# Patient Record
Sex: Female | Born: 1980 | Race: Black or African American | Hispanic: No | Marital: Single | Smoking: Current every day smoker
Health system: Southern US, Community
[De-identification: ages and names within clinical notes are randomized; demographics above are authoritative.]

## PROBLEM LIST (undated history)

## (undated) DIAGNOSIS — Z3490 Encounter for supervision of normal pregnancy, unspecified, unspecified trimester: Secondary | ICD-10-CM

## (undated) DIAGNOSIS — R634 Abnormal weight loss: Secondary | ICD-10-CM

## (undated) DIAGNOSIS — K219 Gastro-esophageal reflux disease without esophagitis: Secondary | ICD-10-CM

## (undated) DIAGNOSIS — IMO0002 Reserved for concepts with insufficient information to code with codable children: Secondary | ICD-10-CM

## (undated) DIAGNOSIS — L089 Local infection of the skin and subcutaneous tissue, unspecified: Principal | ICD-10-CM

## (undated) DIAGNOSIS — I10 Essential (primary) hypertension: Secondary | ICD-10-CM

## (undated) DIAGNOSIS — R87619 Unspecified abnormal cytological findings in specimens from cervix uteri: Secondary | ICD-10-CM

## (undated) HISTORY — PX: TONSILLECTOMY AND ADENOIDECTOMY: SHX28

## (undated) HISTORY — DX: Essential (primary) hypertension: I10

## (undated) HISTORY — DX: Abnormal weight loss: R63.4

## (undated) HISTORY — PX: TONSILLECTOMY: SUR1361

## (undated) HISTORY — DX: Unspecified abnormal cytological findings in specimens from cervix uteri: R87.619

## (undated) HISTORY — PX: BACK SURGERY: SHX140

## (undated) HISTORY — DX: Reserved for concepts with insufficient information to code with codable children: IMO0002

## (undated) HISTORY — DX: Local infection of the skin and subcutaneous tissue, unspecified: L08.9

## (undated) SURGERY — EXCISION LIPOMA
Anesthesia: General | Laterality: Left

---

## 2004-04-18 ENCOUNTER — Ambulatory Visit (HOSPITAL_COMMUNITY): Admission: RE | Admit: 2004-04-18 | Discharge: 2004-04-18 | Payer: Self-pay | Admitting: *Deleted

## 2004-06-12 ENCOUNTER — Ambulatory Visit (HOSPITAL_COMMUNITY): Admission: AD | Admit: 2004-06-12 | Discharge: 2004-06-12 | Payer: Self-pay | Admitting: *Deleted

## 2004-07-03 ENCOUNTER — Ambulatory Visit (HOSPITAL_COMMUNITY): Admission: RE | Admit: 2004-07-03 | Discharge: 2004-07-03 | Payer: Self-pay | Admitting: *Deleted

## 2004-07-13 ENCOUNTER — Emergency Department (HOSPITAL_COMMUNITY): Admission: EM | Admit: 2004-07-13 | Discharge: 2004-07-13 | Payer: Self-pay | Admitting: Emergency Medicine

## 2004-07-29 ENCOUNTER — Ambulatory Visit (HOSPITAL_COMMUNITY): Admission: RE | Admit: 2004-07-29 | Discharge: 2004-07-29 | Payer: Self-pay | Admitting: *Deleted

## 2004-08-13 ENCOUNTER — Ambulatory Visit (HOSPITAL_COMMUNITY): Admission: RE | Admit: 2004-08-13 | Discharge: 2004-08-13 | Payer: Self-pay | Admitting: Family Medicine

## 2004-08-29 ENCOUNTER — Ambulatory Visit (HOSPITAL_COMMUNITY): Admission: AD | Admit: 2004-08-29 | Discharge: 2004-08-29 | Payer: Self-pay | Admitting: *Deleted

## 2004-09-01 ENCOUNTER — Inpatient Hospital Stay (HOSPITAL_COMMUNITY): Admission: AD | Admit: 2004-09-01 | Discharge: 2004-09-03 | Payer: Self-pay | Admitting: *Deleted

## 2004-11-29 ENCOUNTER — Emergency Department (HOSPITAL_COMMUNITY): Admission: EM | Admit: 2004-11-29 | Discharge: 2004-11-29 | Payer: Self-pay | Admitting: Emergency Medicine

## 2006-05-14 ENCOUNTER — Encounter (INDEPENDENT_AMBULATORY_CARE_PROVIDER_SITE_OTHER): Payer: Self-pay | Admitting: Specialist

## 2006-05-14 ENCOUNTER — Other Ambulatory Visit: Admission: RE | Admit: 2006-05-14 | Discharge: 2006-05-14 | Payer: Self-pay | Admitting: Unknown Physician Specialty

## 2006-07-31 ENCOUNTER — Emergency Department (HOSPITAL_COMMUNITY): Admission: EM | Admit: 2006-07-31 | Discharge: 2006-07-31 | Payer: Self-pay | Admitting: Emergency Medicine

## 2007-02-06 ENCOUNTER — Emergency Department (HOSPITAL_COMMUNITY): Admission: EM | Admit: 2007-02-06 | Discharge: 2007-02-06 | Payer: Self-pay | Admitting: Emergency Medicine

## 2007-03-22 ENCOUNTER — Inpatient Hospital Stay (HOSPITAL_COMMUNITY): Admission: EM | Admit: 2007-03-22 | Discharge: 2007-03-25 | Payer: Self-pay | Admitting: Emergency Medicine

## 2007-09-25 ENCOUNTER — Emergency Department (HOSPITAL_COMMUNITY): Admission: EM | Admit: 2007-09-25 | Discharge: 2007-09-25 | Payer: Self-pay | Admitting: Emergency Medicine

## 2007-12-07 ENCOUNTER — Emergency Department (HOSPITAL_COMMUNITY): Admission: EM | Admit: 2007-12-07 | Discharge: 2007-12-07 | Payer: Self-pay | Admitting: Emergency Medicine

## 2008-04-25 ENCOUNTER — Emergency Department (HOSPITAL_COMMUNITY): Admission: EM | Admit: 2008-04-25 | Discharge: 2008-04-25 | Payer: Self-pay | Admitting: Emergency Medicine

## 2008-04-27 ENCOUNTER — Emergency Department (HOSPITAL_COMMUNITY): Admission: EM | Admit: 2008-04-27 | Discharge: 2008-04-27 | Payer: Self-pay | Admitting: Emergency Medicine

## 2008-09-06 ENCOUNTER — Emergency Department (HOSPITAL_COMMUNITY): Admission: EM | Admit: 2008-09-06 | Discharge: 2008-09-06 | Payer: Self-pay | Admitting: Emergency Medicine

## 2008-09-21 ENCOUNTER — Emergency Department (HOSPITAL_COMMUNITY): Admission: EM | Admit: 2008-09-21 | Discharge: 2008-09-21 | Payer: Self-pay | Admitting: Emergency Medicine

## 2009-03-01 ENCOUNTER — Emergency Department (HOSPITAL_COMMUNITY): Admission: EM | Admit: 2009-03-01 | Discharge: 2009-03-01 | Payer: Self-pay | Admitting: Emergency Medicine

## 2009-05-24 ENCOUNTER — Emergency Department (HOSPITAL_COMMUNITY): Admission: EM | Admit: 2009-05-24 | Discharge: 2009-05-24 | Payer: Self-pay | Admitting: Emergency Medicine

## 2009-06-22 ENCOUNTER — Emergency Department (HOSPITAL_COMMUNITY): Admission: EM | Admit: 2009-06-22 | Discharge: 2009-06-23 | Payer: Self-pay | Admitting: Emergency Medicine

## 2010-04-30 LAB — DIFFERENTIAL
Basophils Absolute: 0 10*3/uL (ref 0.0–0.1)
Basophils Relative: 1 % (ref 0–1)
Eosinophils Absolute: 0.1 10*3/uL (ref 0.0–0.7)
Eosinophils Relative: 1 % (ref 0–5)
Lymphocytes Relative: 19 % (ref 12–46)
Lymphs Abs: 0.9 10*3/uL (ref 0.7–4.0)
Monocytes Absolute: 0.5 10*3/uL (ref 0.1–1.0)
Monocytes Relative: 10 % (ref 3–12)
Neutro Abs: 3.4 10*3/uL (ref 1.7–7.7)
Neutrophils Relative %: 70 % (ref 43–77)

## 2010-04-30 LAB — CBC
HCT: 35.9 % — ABNORMAL LOW (ref 36.0–46.0)
Hemoglobin: 12.4 g/dL (ref 12.0–15.0)
MCHC: 34.7 g/dL (ref 30.0–36.0)
MCV: 91.5 fL (ref 78.0–100.0)
Platelets: 156 10*3/uL (ref 150–400)
RBC: 3.92 MIL/uL (ref 3.87–5.11)
RDW: 12.9 % (ref 11.5–15.5)
WBC: 4.9 10*3/uL (ref 4.0–10.5)

## 2010-04-30 LAB — URINALYSIS, ROUTINE W REFLEX MICROSCOPIC
Bilirubin Urine: NEGATIVE
Glucose, UA: NEGATIVE mg/dL
Hgb urine dipstick: NEGATIVE
Ketones, ur: NEGATIVE mg/dL
Nitrite: NEGATIVE
Protein, ur: NEGATIVE mg/dL
Specific Gravity, Urine: 1.015 (ref 1.005–1.030)
Urobilinogen, UA: 0.2 mg/dL (ref 0.0–1.0)
pH: 6.5 (ref 5.0–8.0)

## 2010-04-30 LAB — BASIC METABOLIC PANEL
BUN: 8 mg/dL (ref 6–23)
CO2: 24 mEq/L (ref 19–32)
Calcium: 8.6 mg/dL (ref 8.4–10.5)
Chloride: 102 mEq/L (ref 96–112)
Creatinine, Ser: 0.64 mg/dL (ref 0.4–1.2)
GFR calc Af Amer: >60 mL/min (ref >60)
GFR calc non Af Amer: >60 mL/min (ref >60)
Glucose, Bld: 89 mg/dL (ref 70–99)
Potassium: 4.2 mEq/L (ref 3.5–5.1)
Sodium: 135 mEq/L (ref 135–145)

## 2010-04-30 LAB — WET PREP, GENITAL
Trich, Wet Prep: NONE SEEN
Yeast Wet Prep HPF POC: NONE SEEN

## 2010-04-30 LAB — PREGNANCY, URINE: Preg Test, Ur: NEGATIVE

## 2010-04-30 LAB — GC/CHLAMYDIA PROBE AMP, GENITAL
Chlamydia, DNA Probe: NEGATIVE
GC Probe Amp, Genital: NEGATIVE

## 2010-05-02 LAB — RAPID STREP SCREEN (MED CTR MEBANE ONLY): Streptococcus, Group A Screen (Direct): NEGATIVE

## 2010-05-03 LAB — CBC
HCT: 38.4 % (ref 36.0–46.0)
Hemoglobin: 13.4 g/dL (ref 12.0–15.0)
MCHC: 34.9 g/dL (ref 30.0–36.0)
MCV: 91.5 fL (ref 78.0–100.0)
Platelets: 172 10*3/uL (ref 150–400)
RBC: 4.2 MIL/uL (ref 3.87–5.11)
RDW: 13.2 % (ref 11.5–15.5)
WBC: 9.2 10*3/uL (ref 4.0–10.5)

## 2010-05-03 LAB — DIFFERENTIAL
Basophils Absolute: 0 10*3/uL (ref 0.0–0.1)
Basophils Relative: 0 % (ref 0–1)
Eosinophils Absolute: 0.2 10*3/uL (ref 0.0–0.7)
Eosinophils Relative: 2 % (ref 0–5)
Lymphocytes Relative: 25 % (ref 12–46)
Lymphs Abs: 2.3 10*3/uL (ref 0.7–4.0)
Monocytes Absolute: 0.5 10*3/uL (ref 0.1–1.0)
Monocytes Relative: 6 % (ref 3–12)
Neutro Abs: 6.2 10*3/uL (ref 1.7–7.7)
Neutrophils Relative %: 67 % (ref 43–77)

## 2010-05-03 LAB — BASIC METABOLIC PANEL
BUN: 11 mg/dL (ref 6–23)
CO2: 26 mEq/L (ref 19–32)
Calcium: 8.6 mg/dL (ref 8.4–10.5)
Chloride: 107 mEq/L (ref 96–112)
Creatinine, Ser: 0.78 mg/dL (ref 0.4–1.2)
GFR calc Af Amer: >60 mL/min (ref >60)
GFR calc non Af Amer: >60 mL/min (ref >60)
Glucose, Bld: 88 mg/dL (ref 70–99)
Potassium: 4.1 mEq/L (ref 3.5–5.1)
Sodium: 137 mEq/L (ref 135–145)

## 2010-05-03 LAB — MONONUCLEOSIS SCREEN: Mono Screen: NEGATIVE

## 2010-05-03 LAB — RPR: RPR Ser Ql: NONREACTIVE

## 2010-05-03 LAB — RAPID STREP SCREEN (MED CTR MEBANE ONLY): Streptococcus, Group A Screen (Direct): NEGATIVE

## 2010-05-11 ENCOUNTER — Emergency Department (HOSPITAL_COMMUNITY)
Admission: EM | Admit: 2010-05-11 | Discharge: 2010-05-11 | Disposition: A | Discharge status: Home / Self Care | Payer: 59 | Attending: Emergency Medicine | Admitting: Emergency Medicine

## 2010-05-11 DIAGNOSIS — I10 Essential (primary) hypertension: Secondary | ICD-10-CM | POA: Insufficient documentation

## 2010-05-11 DIAGNOSIS — Z79899 Other long term (current) drug therapy: Secondary | ICD-10-CM | POA: Insufficient documentation

## 2010-05-11 DIAGNOSIS — T6391XA Toxic effect of contact with unspecified venomous animal, accidental (unintentional), initial encounter: Secondary | ICD-10-CM | POA: Insufficient documentation

## 2010-05-11 DIAGNOSIS — T63391A Toxic effect of venom of other spider, accidental (unintentional), initial encounter: Secondary | ICD-10-CM | POA: Insufficient documentation

## 2010-05-21 LAB — COMPREHENSIVE METABOLIC PANEL
AST: 33 U/L (ref 0–37)
Albumin: 3.7 g/dL (ref 3.5–5.2)
BUN: 7 mg/dL (ref 6–23)
Calcium: 8.4 mg/dL (ref 8.4–10.5)
Creatinine, Ser: 0.7 mg/dL (ref 0.4–1.2)
GFR calc Af Amer: >60 mL/min (ref >60)

## 2010-05-21 LAB — DIFFERENTIAL
Basophils Absolute: 0 10*3/uL (ref 0.0–0.1)
Eosinophils Relative: 2 % (ref 0–5)
Lymphocytes Relative: 33 % (ref 12–46)
Lymphs Abs: 2.2 10*3/uL (ref 0.7–4.0)
Monocytes Absolute: 0.4 10*3/uL (ref 0.1–1.0)
Neutro Abs: 3.9 10*3/uL (ref 1.7–7.7)

## 2010-05-21 LAB — URINALYSIS, ROUTINE W REFLEX MICROSCOPIC
Bilirubin Urine: NEGATIVE
Glucose, UA: NEGATIVE mg/dL
Hgb urine dipstick: NEGATIVE
Ketones, ur: NEGATIVE mg/dL
Nitrite: NEGATIVE
Protein, ur: NEGATIVE mg/dL
Specific Gravity, Urine: 1.01 (ref 1.005–1.030)
Urobilinogen, UA: 0.2 mg/dL (ref 0.0–1.0)
pH: 7.5 (ref 5.0–8.0)

## 2010-05-21 LAB — CBC
HCT: 37.6 % (ref 36.0–46.0)
MCHC: 36 g/dL (ref 30.0–36.0)
MCV: 90.3 fL (ref 78.0–100.0)
Platelets: 166 10*3/uL (ref 150–400)
RDW: 13 % (ref 11.5–15.5)

## 2010-05-21 LAB — PREGNANCY, URINE: Preg Test, Ur: NEGATIVE

## 2010-05-21 LAB — LIPASE, BLOOD: Lipase: 23 U/L (ref 11–59)

## 2010-05-25 LAB — DIFFERENTIAL
Basophils Absolute: 0 10*3/uL (ref 0.0–0.1)
Eosinophils Absolute: 0.1 10*3/uL (ref 0.0–0.7)
Lymphs Abs: 1.3 10*3/uL (ref 0.7–4.0)
Monocytes Absolute: 1 10*3/uL (ref 0.1–1.0)
Neutrophils Relative %: 78 % — ABNORMAL HIGH (ref 43–77)

## 2010-05-25 LAB — COMPREHENSIVE METABOLIC PANEL
ALT: 34 U/L (ref 0–35)
Alkaline Phosphatase: 80 U/L (ref 39–117)
BUN: 7 mg/dL (ref 6–23)
CO2: 27 mEq/L (ref 19–32)
GFR calc non Af Amer: >60 mL/min (ref >60)
Glucose, Bld: 91 mg/dL (ref 70–99)
Potassium: 2.9 mEq/L — ABNORMAL LOW (ref 3.5–5.1)
Sodium: 135 mEq/L (ref 135–145)
Total Protein: 7 g/dL (ref 6.0–8.3)

## 2010-05-25 LAB — CBC
HCT: 35.1 % — ABNORMAL LOW (ref 36.0–46.0)
Hemoglobin: 12.2 g/dL (ref 12.0–15.0)
MCHC: 34.9 g/dL (ref 30.0–36.0)
RBC: 3.79 MIL/uL — ABNORMAL LOW (ref 3.87–5.11)
RDW: 13 % (ref 11.5–15.5)

## 2010-05-25 LAB — URINALYSIS, ROUTINE W REFLEX MICROSCOPIC
Bilirubin Urine: NEGATIVE
Glucose, UA: NEGATIVE mg/dL
Ketones, ur: NEGATIVE mg/dL
Leukocytes, UA: NEGATIVE
Protein, ur: NEGATIVE mg/dL
pH: 6 (ref 5.0–8.0)

## 2010-05-25 LAB — URINE MICROSCOPIC-ADD ON

## 2010-05-25 LAB — URINE CULTURE: Colony Count: >=100000

## 2010-06-27 NOTE — Group Therapy Note (Signed)
NAME:  Bonnie Jones, Bonnie Jones              ACCOUNT NO.:  [id number]   MEDICAL RECORD NO.:  [id number]          PATIENT TYPE:  INP   LOCATION:  A311                          FACILITY:  [facility]   PHYSICIAN:  Dorris Singh, DO    DATE OF BIRTH:  01-07-1981   DATE OF PROCEDURE:  03/22/2007  DATE OF DISCHARGE:                                 PROGRESS NOTE   The patient is seen today.  She states she does not feel any better, is  still having some pain and some difficulty eating.  However, clinically  her white count has decreased.  Will continue her on the current  medication schedule.   PHYSICAL EXAMINATION:  VITAL SIGNS:  Her temperature is 97.0, pulse 54,  respirations 18, blood pressure 109/60.  GENERAL:  The patient is a 30 year old female who is well-developed,  well-nourished and in no acute distress.  NECK:  Her neck is somewhat swollen on the right side with some erythema  in the throat.  HEART:  Regular rate and rhythm.  LUNGS:  Clear to auscultation bilaterally.  ABDOMEN:  Soft, nontender, nondistended.  EXTREMITIES:  Positive pulses.  No edema, ecchymosis.   Labs for today, white count of 8.3, hemoglobin 11.3, hematocrit 33.5 and  her platelet count of 156.  Her chemistries within normal limits.  Her  blood culture currently is no growth for 1 day.  Her strep A probe is  negative and her streptococcus screen direct is negative.   ASSESSMENT AND PLAN:  1. Right peritonsillar phlegmon.  This seems to be, at least her white      count seems to be improving.  Clinically she is still not improved      but hopefully in the next 24 hours we will see that.  We will      continue with a full liquid diet and then increase as tolerated and      will continue with the Unasyn as well.  2. Tobacco abuse.  I spoke with the patient regarding quitting      smoking.  She said she would like to do that.  3. Pharyngitis.  This is consequence of #1.  We will continue with      current antibiotics  and any palliative measures necessary.      Dorris Singh, DO  Electronically Signed     CB/MEDQ  D:  03/23/2007  T:  03/23/2007  Job:  [id number]

## 2010-06-27 NOTE — Discharge Summary (Signed)
NAME:  Bonnie Jones, Bonnie Jones              ACCOUNT NO.:  [id number]   MEDICAL RECORD NO.:  [id number]          PATIENT TYPE:  INP   LOCATION:  A311                          FACILITY:  [facility]   PHYSICIAN:  Dorris Singh, DO    DATE OF BIRTH:  24-Nov-1980   DATE OF ADMISSION:  03/22/2007  DATE OF DISCHARGE:  02/10/2009LH                               DISCHARGE SUMMARY   ADMISSION DIAGNOSIS:  1. Right peritonsillar phlegmon.  2. Tobacco abuse.  3. Pharyngitis.   DISCHARGE DIAGNOSIS:  1. Right peritonsillar phlegmon resolving.  2. Tobacco abuse.   TESTS PERFORMED:  She had a CT on February 9 which showed peritonsilar  phlegmon right peri otitis.   PRIMARY CARE PHYSICIAN:  She does not have one. We will set her up with  Dr. Catalina Pizza at discharge.   HISTORY OF PRESENT ILLNESS:  Her H&P was done by Dr. Dorris Singh. To  summarize, the patient is 30 year old female who presented to the [facility] ED with complaint of throat pain. CT demonstrated above finding.  She was then admitted after talking with Dr. Jearld Fenton from [facility]  that she needed to be placed on 2-3 days of antibiotic therapy.  She was  then admitted to the service of [facility] and started on Unasyn. She was  placed on a clear liquid diet while she was admitted, though the patient  was still having a lot of pain associated with swallowing so she did not  eat. On day 2, she continued to improve.  She was able to eat a little  bit but still having a lot of pain.  We placed her on pain medication as  well. Continue to follow her CBC with her white count continuing to  decrease. On February 9, her white count was absolutely normal. However,  the patient was still having some residual pain and pain in her right  ear. I went ahead and decided to keep her one more day on IV  antibiotics.  Today she was seen and is excited about going home.  Also  she has stated that she will stop smoking.  She has not been smoking  since she  has been here so she has taken smoking cessation information.   Her vitals are normal.  Her blood count and her labs are within normal  limits and it is okay to discharge her at this point in time. On  February 9, she was hyperkalemic. We corrected this with  supplementation. Her condition is stable. Her disposition will be to  home. She will be sent home on the following medications.  1. Augmentin XR 2 tabs p.o. b.i.d. x7 days.  2. Darvocet-N 50 one p.o. q. 4-6 hours p.r.n. pain.   DISCHARGE INSTRUCTIONS:  Take Tylenol and Motrin for pain as scheduled  and then take Darvocet-N 50 one every 4-6 hours for breakthrough pain.  She will be recommended to increase her activity slowly and to be on a  soft diet for the next 2-3 days and then we will set her up an  appointment to see Dr. Catalina Pizza.  It will be explained to her  that she may need a repeat CT of the throat due to this being up a  phlegmon to make sure that there is complete resolution and if she is  still having pain after completing her course of antibiotics, she will  need immediate followup. I have discussed the risks and benefits of not  following up regarding the location of her current infection.      Dorris Singh, DO  Electronically Signed     CB/MEDQ  D:  03/25/2007  T:  03/25/2007  Job:  [id number]

## 2010-06-27 NOTE — H&P (Signed)
NAME:  Bonnie Jones, Bonnie Jones              ACCOUNT NO.:  [id number]   MEDICAL RECORD NO.:  [id number]          PATIENT TYPE:  INP   LOCATION:  A311                          FACILITY:  [facility]   PHYSICIAN:  Dorris Singh, DO    DATE OF BIRTH:  1981/01/30   DATE OF ADMISSION:  03/22/2007  DATE OF DISCHARGE:  LH                              HISTORY & PHYSICAL   The patient is a 30 year old female who presented to [facility]  with a complaint of throat pain.  The patient stated that she had a sore  throat that started 3 days ago.  The onset was gradual.  She has noted  for the last 2 days she has not been able to eat or smoke, and became so  painful that she just decided to come and get it looked at by the  emergency room.  She states that there were no remaining factors and it  has been associated with a change in voice, difficulty swallowing, right  ear pain, lymphadenopathy.  Denies any fever, nausea or vomiting.  The  patient said that she tried some palliative measures, but did not have  any relief.   PAST MEDICAL HISTORY:  Her last normal period was March 22, 2007.   SURGICAL HISTORY:  Tonsillectomy when she was 8.   SOCIAL HISTORY:  She denies any drug use, but does smoke about 5  cigarettes a day and does drink socially.   ALLERGIES:  NO KNOWN DRUG ALLERGIES.   Currently she is not taking any medications.   REVIEW OF SYSTEMS:  Negative for fever, weight loss, or chills.  EYES:  Negative for eye pain or visual changes.  ENT:  Positive for ear pain  and sore throat.  CARDIOVASCULAR:  Negative for chest pain.  RESPIRATORY:  Negative for cough or wheezing.  GASTROINTESTINAL:  Negative for nausea and vomiting.  GENITOURINARY:  Negative for dysuria  or flank pain.  MUSCULOSKELETAL:  Negative for arthralgias or back pain.  SKIN:  Negative for pruritus or rash.  NEUROLOGIC: Negative for headache  or weakness.  METABOLIC:  Negative for excessive thirst or cold  intolerance.   ALLERGIC:  Negative for rash or pruritus.   PHYSICAL EXAMINATION:  VITAL SIGNS:  Blood pressure 119/67.  Pulse rate  63.  Respirations 20.  Temperature 98.1.  GENERAL:  Generally this is a well-developed, well-nourished 30 year old  who was in no acute distress.  HEENT:  Head is normocephalic, atraumatic.  Eyes are EOMI.  No scleral  icterus or conjunctival injection.  TMIs are normal. Pharynx is  erythematous on the right side peritonsillar area.  Oropharynx is  erythematous as well.  NECK:  Supple.  There is some tenderness on the right side.  HEART:  Regular rate and rhythm.  No murmurs noted.  RESPIRATION:  Clear to auscultation bilaterally.  No rales, wheezes, or  rhonchi.  ABDOMEN:  Soft, nontender, nondistended.  No masses or rebound noted.  Bowel sounds in all 4 quadrants.  EXTREMITIES:  Positive pulses.  Full range of motion, no ecchymosis or  edema.  NEUROLOGIC:  Cranial nerves  2-12 grossly intact.  Sensation normal.  Motor intact in all extremities.  As I mentioned in the neck, there is  positive lymphadenopathy.   The patient was started on Unasyn IV while she was in the ED.  Had a CT  that showed a peritonsillar phlegmon, right periotitis.  According to  the [facility],  the PA she spoke with, Dr. Jearld Fenton an ENT from [facility] who advised them the patient be set on IV antibiotics.   ASSESSMENT:  1. Right peritonsillar phlegmon.  2. Tobacco abuse.  3. Pharyngitis.   PLAN:  Will admit the patient to 3-A.  Will start the patient on Unasyn  3 mg IV q.8.  Also will place the patient on a clear liquid diet and  advance to solid as tolerated.  Will give the patient Tylenol starting  at 50 mg.  Will check a CBC and a BMET in the morning.  Also will  continue to monitor her and give her pain medications if she is  uncomfortable.  Due to her smoking she will have available to her an  albuterol nebulizer.  Will do DVT and GI prophylaxis and will continue  to monitor the  patient and change therapy as noted.      Dorris Singh, DO  Electronically Signed     CB/MEDQ  D:  03/22/2007  T:  03/22/2007  Job:  [id number]

## 2010-06-27 NOTE — Group Therapy Note (Signed)
NAME:  Bonnie Jones, DEVORA              ACCOUNT NO.:  [id number]   MEDICAL RECORD NO.:  [id number]          PATIENT TYPE:  INP   LOCATION:  A311                          FACILITY:  [facility]   PHYSICIAN:  Dorris Singh, DO    DATE OF BIRTH:  [birth year]   DATE OF PROCEDURE:  03/24/2007  DATE OF DISCHARGE:                                 PROGRESS NOTE   The patient seen today resting in bed.  States she is still having some  problems with swallowing.  She still has not really eaten anything  because of the pain.  Had taken some pain medication, but is complaining  that it made her nauseous, but states that she is feeling a little bit  better.  This is her third day of antibiotic therapy, and will continue  with that.   PHYSICAL EXAMINATION:  VITAL SIGNS:  Temperature 97.6, pulse 50,  respirations 20, blood pressure 124/66.  GENERAL:  This is a 30 year old female who is well-developed, well-  nourished in no acute distress.  HEART:  Regular rate and rhythm.  LUNGS:  Clear to auscultation bilaterally.  ABDOMEN:  Soft, nontender, nondistended.  HEENT:  There is slight swelling of the right vocal surface.  Lymphadenopathy has decreased behind the ear as well as on the right  posterior chain.   LABORATORY DATA:  White count 6.7, hemoglobin 11.4, hematocrit 32,  platelet count 158.  Sodium is 138, potassium 3.3, chloride 107, CO2 26,  glucose 87, BUN 2, creatinine 0.67.   ASSESSMENT/PLAN:  1. Right peritonsillar phlegmon seems to be improving clinically so      will go ahead and continue the antibiotic therapy.  2. Tobacco abuse.  Spoke with the patient regarding quitting.  She      said she would like to do that.  3. Pharyngitis which is a consequence of #1.  Will continue her on      pain medication.  Will change it.  Anticipate discharge within 24-      48 hours.      Dorris Singh, DO  Electronically Signed     CB/MEDQ  D:  03/24/2007  T:  03/24/2007  Job:  [id number]

## 2010-06-30 NOTE — Consult Note (Signed)
NAME:  Bonnie Jones, Bonnie Jones              ACCOUNT NO.:  [id number]   MEDICAL RECORD NO.:  [id number]          PATIENT TYPE:  OIB   LOCATION:  LDR1                          FACILITY:  [facility]   PHYSICIAN:  Lazaro Arms, M.D.   DATE OF BIRTH:  December 31, 1980   DATE OF CONSULTATION:  DATE OF DISCHARGE:  08/13/2004                                   CONSULTATION   HISTORY OF PRESENT ILLNESS:  Bonnie Jones is a gravida 1, para 0, patient of Dr.  Preston Fleeting at [redacted] weeks gestation who presented complaining of possible labor.  On the external fetal monitor she is having very rare uterine activity.  She  has reactive NST, no decelerations.  She denies any rupture of membranes, no  bleeding, and no other pregnancy problems.  She was maintained on the  monitor for approximately 2 hours, and there was no significant change in  her uterine activity.  As a result, this is ruled to be a false labor.  She  was given routine after care instructions, and is to keep her appointment  with Dr. Lisette Grinder, as scheduled.       LHE/MEDQ  D:  08/13/2004  T:  08/14/2004  Job:  [id number]   cc:   Langley Gauss, MD  [address]  [place]  [place] [address]  Fax: [phone]

## 2010-06-30 NOTE — Group Therapy Note (Signed)
NAME:  Bonnie Jones, Bonnie Jones              ACCOUNT NO.:  [id number]   MEDICAL RECORD NO.:  [id number]          PATIENT TYPE:  INP   LOCATION:  LDR1                          FACILITY:  [facility]   PHYSICIAN:  Langley Gauss, MD     DATE OF BIRTH:  18-Sep-1980   DATE OF PROCEDURE:  09/01/2004  DATE OF DISCHARGE:                                   PROGRESS NOTE   The last time I examined the patient, she was 5 cm dilated, 80% effaced,  vertex presentation confirmed.  Pitocin at 6 mL/min., contracting about  every five to six minutes.  That was immediately after placement of the  epidural catheter.  She continues to get more relief from the epidural  catheter but has begun feeling specifically rectal pressure.  External   Dictation ended at this point.       DC/MEDQ  D:  09/02/2004  T:  09/02/2004  Job:  [id number]

## 2010-06-30 NOTE — Op Note (Signed)
NAME:  Bonnie Jones, Bonnie Jones              ACCOUNT NO.:  [id number]   MEDICAL RECORD NO.:  [id number]          PATIENT TYPE:  INP   LOCATION:  A402                          FACILITY:  [facility]   PHYSICIAN:  Langley Gauss, MD     DATE OF BIRTH:  1980/02/15   DATE OF PROCEDURE:  09/01/2004  DATE OF DISCHARGE:                                 OPERATIVE REPORT   PROCEDURE:  Removal of  Foley bulb.   Foley bulb catheter has been in place. The initial bleeding which occurred  was limited and completely resolved.  The patient continued have reassuring  fetal heart rate with no change in uterine tone. She did begin having some  uterine contractions every 5 minutes, very mild in intensity. I was able to  remove the Foley bulb catheter without deflating the bulb in any manner.  Examination afterwards revealed the vertex to be non-engaged.  Leopold's  maneuvers date confirmed vertex presentation.  It turns out the patient had  a full bladder when she got up to void and returned to the bed, she was  noted to be 3 cm in 60% effaced, -1 station.  Vertex well applied to the  posterior cervix. Fetal scalp electrode was placed with resultant amniotomy  findings of clear amniotic fluid. Fetal heart rate remains reassuring. The  patient to be observed for onset of active labor. If adequate uterine  contractions do not ensue, will augment her, induce with Pitocin as  clinically indicated.       DC/MEDQ  D:  09/01/2004  T:  09/02/2004  Job:  [id number]

## 2010-06-30 NOTE — Group Therapy Note (Signed)
NAME:  Bonnie Jones, Bonnie Jones              ACCOUNT NO.:  [id number]   MEDICAL RECORD NO.:  [id number]          PATIENT TYPE:  INP   LOCATION:  A402                          FACILITY:  [facility]   PHYSICIAN:  Langley Gauss, MD     DATE OF BIRTH:  December 03, 1980   DATE OF PROCEDURE:  09/02/2004  DATE OF DISCHARGE:                                   PROGRESS NOTE   After placement of the epidural the patient noted be 5 cm dilated 80%  effaced, -1 station, vertex presentation confirmed, contracting every.5  minutes, continued with a reassuring fetal heart rate and clear amniotic  fluid. This a.m. the patient is beginning to have rectal type pressure.  She  is examined noted to be 9 cm dilated, vertex +1, completely effaced with a  reassuring fetal heart rate. She is given a dose of five, 5 mL 2% lidocaine  through the epidural to relieve this rectal pressure and so she is ready to  begin a second stage of labor. Progress is clinically adequate and  anticipate vaginal delivery.       DC/MEDQ  D:  09/02/2004  T:  09/02/2004  Job:  [id number]

## 2010-06-30 NOTE — Op Note (Signed)
NAME:  Bonnie Jones, Bonnie Jones              ACCOUNT NO.:  [id number]   MEDICAL RECORD NO.:  [id number]          PATIENT TYPE:  INP   LOCATION:  LDR1                          FACILITY:  [facility]   PHYSICIAN:  Langley Gauss, MD     DATE OF BIRTH:  April 22, 1980   DATE OF PROCEDURE:  DATE OF DISCHARGE:  09/03/2004                                 OPERATIVE REPORT   PREOPERATIVE DIAGNOSES:  1.  Intrauterine pregnancy at 38+ weeks.  2.  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   PROCEDURE:  1.  September 02, 2004, placement continuous lumbar epidural analgesia.  Left      vacuum extraction utilizing Kiwi vacuum extractor, delivery of 5 pound      12 ounce female infant.  2.  Midline episiotomy and repair.  3.  Prolonged rupture of membranes with rupture of membranes x19 hours.  The      patient did receive one dose of IV Cleocin about one hour prior to      delivery. She is noted be GBS carrier status negative.   DELIVERY NOTE:  The patient had an epidural place early a.m. of September 02, 2004, after which time she was noted be 5 cm dilated.  Subsequently, she  continued during the active phase of labor through the night to reach  complete dilatation.  At 9 cm, she had a strong urge to push.  She was  treated 5, 5 mL 2% lidocaine plain.  This facilitated expectant management  to complete dilatation at which time she has a strong urge to push.  She was  placed in the dorsal lithotomy position, prepped and draped in the usual  sterile manner.  The Foley catheter was removed. With distension of the  perineum, a total of 30 cc 1% lidocaine plain is injected.  A small midline  episiotomy was performed.  The patient was able to have descent of the  vertex to the perineal floor but no further with distension the perineum.  A  small midline episiotomy was performed. The infant then delivered in a  direct OA position over this midline episiotomy without extension.  A nuchal  cord x1 without  compression during the course of labor was reduced.  Spontaneous rotation occurred to a left anterior shoulder position. Gentle  downward traction combined expulsive efforts resulted in delivery of this  anterior shoulder beneath pubis synthesis without difficulty.  The umbilical  cord is milked toward the infant. The cord was doubly clamped and cut.  Mouth and nares were bulb suctioned of clear amniotic fluid.  Infant is  placed on maternal abdomen.  Arterial cord gas and cord blood are then  obtained.  Gentle traction umbilical cord results in separation which upon  examination is an intact placenta with associated three-vessel umbilical  cord.  Excellent uterine tone was achieved following delivery.  Examination  of the genital tract reveals no lacerations.  The midline episiotomy was  easily repaired utilizing 0 chromic in a running  locked fashion on the vaginal mucosa followed by two-layer closure of 0  chromic on the  perineal body.  The patient tolerated the delivery very well.  She is taken out of the dorsal lithotomy position, rolled to her side at  which time the epidural catheter was removed with the blue tip noted to be  intact.       DC/MEDQ  D:  09/02/2004  T:  09/02/2004  Job:  [id number]

## 2010-06-30 NOTE — H&P (Signed)
NAME:  Bonnie Jones, Bonnie Jones              ACCOUNT NO.:  [id number]   MEDICAL RECORD NO.:  [id number]          PATIENT TYPE:  OIB   LOCATION:  LDR2                          FACILITY:  [facility]   PHYSICIAN:  Langley Gauss, MD     DATE OF BIRTH:  1980-06-13   DATE OF ADMISSION:  DATE OF DISCHARGE:  LH                                HISTORY & PHYSICAL   HISTORY OF PRESENT ILLNESS:  This is a 30 year old gravida 1, para 0, with  an EDC of September 12, 2004, based upon a 10-week crown-rump length obtained in  [place], and confirmed by a 19 week ultrasound at [facility].  The patient is at present having irregular uterine contractions and a  significant amount of pelvic pressure.   PRENATAL COURSE:  She initially received prenatal care in [place],  [place], where she was noted to have a class I Pap smear.  GC and  Chlamydia cultures negative.  Hepatitis B surface antigen negative.  Hepatitis C antibody negative.  HSV 1/2 IgG was noted to be negative with no  HSV 1 or HSV 2 IgG antibodies detected.  RPR was nonreactive.  Hemoglobin of  14.5, hematocrit 40.2.  B positive blood type.  RPR and hepatitis B surface  antigen negative.   Upon transferring her care here, her initial visit was noted to be May 11, 2004 at [redacted] weeks gestation.  She did have a normal AFP triple screen  performed prior to transfer here.  Glucose tolerance test is normal at 97.  Sickledex negative.  Rubella immune.  The patient subsequently has had  repeat ultrasound on Jul 03, 2004 with a normal anatomic survey and  appropriate growth during the intervening time period.   PAST MEDICAL HISTORY:  The patient does have a history of Chlamydia, which  was treated.  She also has a history of an external genital condyloma.  The  patient pertinently, did undergo surgical procedure for excision of  condyloma on December 17, 1997 in [place].  She also, at that time,  underwent a laser ablation externally, and a  superficial laser vaporization  was performed of the cervix.  The patient has a history of asthma as a  child.  She has not used an inhaler x18 years duration.   SOCIAL HISTORY:  The patient is a minimal smoker.  She is noted to be  sisters with Jerl Santos, and describes as bi-racial with a black mother  and a white father.  She is unemployed.  The father of the baby is not  involved.   PHYSICAL EXAMINATION:  GENERAL:  Pale-skinned black person, or dark-skinned  white person, in no acute distress.  VITAL SIGNS:  Height is 5 feet, 5 inches.  Pre-pregnancy weight 152; most  recent weight 201.  Blood pressure is 125/78, pulse rate 80, respiratory  rate 20.  HEENT:  Negative.  No adenopathy.  NECK:  Supple.  Thyroid is non-palpable.  LUNGS:  Clear.  CARDIOVASCULAR:  Regular rate and rhythm.  ABDOMEN:  Soft and nontender.  No surgical scars were identified.  Fundal  height is  38 cm.  She has vertex presentation by Leopold's maneuvers.  EXTREMITIES:  Noted to be normal.  PELVIC:  Normal external genitalia.  No lesions or ulcerations identified.  No leakage of fluid.  No vaginal bleeding.  Cervix noted to be 1 cm dilated,  0 station, 70% effaced.  Vertex presentation confirmed.  There is some  synechiae present.  The vertex is well applied to the cervix.  She does have  some external genital condyloma, very minimal in quantity, located in the  posterior vestibule.  Fetal heart tones are auscultated in the 150s.   ASSESSMENT:  A 38.5 week intrauterine pregnancy with good dating criteria.  The patient with significant pelvic pressure and discomfort.  At this point  in time, she is also noted to have some synechiae present on the cervix.  Thus, she will be an excellent candidate for placement of Foley bulb for  mechanical ripening prior to amniotomy.  Scheduled date of induction is August 31, 2004 at 0800.  The patient states that for postpartum birth control  purposes, she would like to  utilize Depo-Provera.  She is planning on bottle  feeding.       DC/MEDQ  D:  08/29/2004  T:  08/29/2004  Job:  [id number]

## 2010-06-30 NOTE — Discharge Summary (Signed)
NAME:  Bonnie Jones, Bonnie Jones              ACCOUNT NO.:  [id number]   MEDICAL RECORD NO.:  [id number]          PATIENT TYPE:  INP   LOCATION:  A402                          FACILITY:  [facility]   PHYSICIAN:  Langley Gauss, MD     DATE OF BIRTH:  Mar 16, 1980   DATE OF ADMISSION:  09/01/2004  DATE OF DISCHARGE:  07/23/2006LH                                 DISCHARGE SUMMARY   DISCHARGE DIAGNOSES:  1.  Placement of Foley bulb for mechanical ripening of cervix prior to      amniotomy September 01, 2004.  2.  Placement of epidural analgesic September 02, 2004.  2.  Date of vaginal delivery by outlet vacuum extractor September 02, 2004. Date      of discharge is P.M. September 03, 2004.  Delivered is a 5 pound 12 ounce      female infant.  3.  Urinary tract infection.   DISPOSITION:  The patient did receive Depo-Provera 150 mg IM at the time of  discharge.   DISCHARGE MEDICATIONS:  1.  Tylox #20 with no refill.  2.  The patient also received Macrodantin 100 mg p.o. b.i.d. x7 days.  3.  She had received one gram of IV Rocephin during the course of labor due      to additional diagnoses of rupture of membranes times greater than 18      hours in duration prior to delivery.   LABORATORY STUDIES:  Admission hemoglobin/hematocrit 12.4/34.7.  Postpartum  day one 10.9/30.5 with a white count of 19.2.  RPR is nonreactive.   HOSPITAL COURSE:  See previous dictations.  Admitted August 31, 2004.  Foley  bulb was placed for mechanicalripening of the cervix.  This did have a  complication of onset of some dark red vaginal bleeding which was limited in  its quantity.  There was no evidence of any fetal compromise.  Foley bulb  was left in place.  It was removed later in the day September 01, 2004 at which  time I was able to proceed with amniotomy.  The patient had initially  received only IV narcotics during the course of labor for pain relief. When  these showed themselves to be inadequate for pain relief the patient  requested  epidural analgesic which was placed without complications.  The  patient subsequently delivered by outlet vacuum extractor utilizing kiwi  vacuum extractor.  This was performed without complications.  Postpartum the  patient did well.  She bonded well with the infant.  She was bottle feeding  and had good family support.  Infant was followed by Dr. ___________ in the  nursery.  After final assessment the infant was discharged to home on  postpartum day number one and one half.  The patient is given standard  discharge sessions and will follow-up in the office in four weeks' time for  continued postpartum care.     DC/MEDQ  D:  09/04/2004  T:  09/04/2004  Job:  [id number]

## 2010-06-30 NOTE — Op Note (Signed)
NAME:  Bonnie Jones, Bonnie Jones              ACCOUNT NO.:  [id number]   MEDICAL RECORD NO.:  [id number]          PATIENT TYPE:  INP   LOCATION:  LDR1                          FACILITY:  [facility]   PHYSICIAN:  Langley Gauss, MD     DATE OF BIRTH:  Sep 19, 1980   DATE OF PROCEDURE:  09/01/2004  DATE OF DISCHARGE:                                 OPERATIVE REPORT   PROCEDURE:  Placement of continuous lumbar epidural analgesia at the L3-4  interspace performed without complications.   SUMMARY:  Appropriate informed consent was obtained.  The patient was placed  in the seated position, bony landmarks were identified.  The L3-4 interspace  was chosen.  The patient's back is sterilely prepped and draped.  Utilizing  the epidural tray, 5 mL of 1% lidocaine plain injected at the midline of the  L3-4 interspace to raise a small skin wheal.  A 17-gauge Tuohy-Schliff  needle was then utilized with loss of resistance in an air-filled glass  syringe to identify the epidural space.  On the first attempt, the bony  spinous process was encountered.  The epidural needle was thus withdrawn and  directed slightly cephalic.  On the second attempt, there was excellent loss  of resistance consistent with entry into the epidural space.  Initial test  dose of 5 mL 1.5% lidocaine with epinephrine injected through the epidural  needle, no signs of CSF or intravascular injection obtained.  The epidural  catheter was then inserted to a depth of 5 cm, needle was removed,  aspiration test is negative.  A second test dose of 2mL 1.5% lidocaine plus  epinephrine injected through the epidural catheter.  Again, no signs of CSF  or intravascular injection obtained.  The catheter was secured into place.  The patient is returned to the lateral supine position.  She does have  evidence of an adequate bilateral block, which is setting up.  The patient  is placed on the infusion pump containing a standard mixture.  She is  treated with a  continuous infusion of 14 mL/hr.  Fetal heart rate remains  reassuring.  Examination:  5 cm dilated, 80% effaced, vertex at a 0 station  and well-applied to the cervix.  No significant cervical scarring is noted  at this time.       DC/MEDQ  D:  09/01/2004  T:  09/02/2004  Job:  [id number]

## 2010-06-30 NOTE — Op Note (Signed)
NAME:  Bonnie Jones, Bonnie Jones              ACCOUNT NO.:  [id number]   MEDICAL RECORD NO.:  [id number]          PATIENT TYPE:  INP   LOCATION:  LDR1                          FACILITY:  [facility]   PHYSICIAN:  Langley Gauss, MD     DATE OF BIRTH:  10-04-1980   DATE OF PROCEDURE:  09/01/2004  DATE OF DISCHARGE:                                 OPERATIVE REPORT   PROCEDURE:  Placement of Foley bulb for mechanical ripening of cervix prior  to amniotomy and induction.   SURGEON:  Dr. Langley Gauss.   COMPLICATIONS:  Onset bright red vaginal bleeding following the procedure,  limited in nature, with cessation of bleeding noted during extended  observation.   SUMMARY:  Patient admitted for induction of labor. 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  This is precluding the patient from  further dilatation at this time.  Decision made for a Foley bulb induction  to remedy these cervical synechiae.  She is placed in dorsal lithotomy  position. A 16-French Foley bulb catheter is utilized.  With the patient in  the stirrups, cervix is fingertip dilated. The soft 16-French Foley bulb was  unsuccessfully passed through the endocervical os. The white firmer 16-  French Foley bulb was then utilized.  On this attempt with the firmer Foley  bulb, I was able to successfully pass through the endocervical os, however,  this did result in onset of some bright red vaginal bleeding It was inflated  to a volume of 50 mL of sterile normal saline and gentle traction is then  applied. The was taped to the patient's leg. Of note, prior to the  procedure, the patient has a reactive nonstress test with the uterine  contractions very minimal, normal uterine tone.  Following the procedure,  uterine tone is unchanged.  The patient continues to have a reassuring fetal  heart rate with no fetal heart rate decelerations both prior to  and post  procedure.  The patient was observed x45 minutes following placement of the  Foley bulb catheter.  The bleeding was limited in nature. Total estimated  blood loss about 25 mL, which is felt to be of maternal origin likely due to  the Foley catheter impinging upon the placenta.       DC/MEDQ  D:  09/01/2004  T:  09/01/2004  Job:  [id number]

## 2010-09-12 ENCOUNTER — Ambulatory Visit (INDEPENDENT_AMBULATORY_CARE_PROVIDER_SITE_OTHER): Payer: 59 | Admitting: General Surgery

## 2010-09-26 ENCOUNTER — Ambulatory Visit (INDEPENDENT_AMBULATORY_CARE_PROVIDER_SITE_OTHER): Payer: 59 | Admitting: General Surgery

## 2010-09-26 ENCOUNTER — Encounter (INDEPENDENT_AMBULATORY_CARE_PROVIDER_SITE_OTHER): Payer: Self-pay | Admitting: General Surgery

## 2010-09-26 VITALS — BP 136/82 | HR 58 | Temp 97.4°F | Ht 65.5 in | Wt 159.1 lb

## 2010-09-26 DIAGNOSIS — D1779 Benign lipomatous neoplasm of other sites: Secondary | ICD-10-CM

## 2010-09-26 DIAGNOSIS — D171 Benign lipomatous neoplasm of skin and subcutaneous tissue of trunk: Secondary | ICD-10-CM | POA: Insufficient documentation

## 2010-09-26 NOTE — Progress Notes (Signed)
HPI The patient comes in with a 6 year history of a left upper back lump which is intermittently symptomatic with discomfort especially when she leans against a Hypaque tears. She's had no fevers or chills or redness associated with it. She was told there was likely a lipoma and I agree that the chances are that it is a lipoma possibly submuscular.  PE On examination her pulse is 58 blood pressure 136/82 and she is afebrile with a temperature 97.69F.  Focus my examination on the patient's left upper back where she has a 4 cm soft somewhat mobile mass in the upper back. As in the upper portion of the trapezius muscle on the back.   Studiy review There are no current studies to review  Assessment Likely 4 cm lipoma and/or cyst of the left upper back in the trapezius area.  Plan The plan is to remove this under general anesthesia as an outpatient in day surgery. The patient understands risks and benefits she understands also that she were replaced the lump with a scar and she is well aware that this will be done as an outpatient.

## 2010-11-03 LAB — RAPID STREP SCREEN (MED CTR MEBANE ONLY): Streptococcus, Group A Screen (Direct): NEGATIVE

## 2010-11-03 LAB — CBC
HCT: 33.5 — ABNORMAL LOW
HCT: 41.7
Hemoglobin: 11.4 — ABNORMAL LOW
MCHC: 35.2
MCHC: 35.3
MCHC: 35.6
MCV: 91.3
Platelets: 156
Platelets: 181
RBC: 3.51 — ABNORMAL LOW
RBC: 3.53 — ABNORMAL LOW
RBC: 4.56
RDW: 12.8
RDW: 12.9
WBC: 11.6 — ABNORMAL HIGH
WBC: 5.3
WBC: 8.3

## 2010-11-03 LAB — DIFFERENTIAL
Basophils Absolute: 0
Basophils Relative: 1
Eosinophils Absolute: 0.2
Eosinophils Absolute: 0.2
Eosinophils Relative: 0
Eosinophils Relative: 2
Eosinophils Relative: 3
Lymphocytes Relative: 56 — ABNORMAL HIGH
Lymphs Abs: 2.4
Lymphs Abs: 2.5
Lymphs Abs: 2.9
Monocytes Absolute: 0.4
Monocytes Relative: 6
Monocytes Relative: 8
Neutro Abs: 1.8
Neutrophils Relative %: 33 — ABNORMAL LOW
Neutrophils Relative %: 51
Neutrophils Relative %: 77
nRBC: 0

## 2010-11-03 LAB — CULTURE, BLOOD (ROUTINE X 2)
Culture: NO GROWTH
Report Status: 2122009

## 2010-11-03 LAB — BASIC METABOLIC PANEL
BUN: 4 — ABNORMAL LOW
BUN: 4 — ABNORMAL LOW
CO2: 26
CO2: 26
CO2: 29
Calcium: 7.8 — ABNORMAL LOW
Calcium: 8.3 — ABNORMAL LOW
Chloride: 106
Creatinine, Ser: 0.64
Creatinine, Ser: 0.65
Creatinine, Ser: 0.67
GFR calc Af Amer: >60
GFR calc Af Amer: >60
GFR calc Af Amer: >60
GFR calc non Af Amer: >60
GFR calc non Af Amer: >60
Glucose, Bld: 81
Potassium: 3.6
Potassium: 4.1
Sodium: 139

## 2010-11-03 LAB — STREP A DNA PROBE: Group A Strep Probe: NEGATIVE

## 2010-11-10 LAB — POCT I-STAT, CHEM 8
Calcium, Ion: 1.17
Chloride: 104
Creatinine, Ser: 0.7
Glucose, Bld: 87
Hemoglobin: 14.3
Potassium: 3.7

## 2010-11-10 LAB — URINALYSIS, ROUTINE W REFLEX MICROSCOPIC
Glucose, UA: NEGATIVE
Hgb urine dipstick: NEGATIVE
Protein, ur: NEGATIVE
Specific Gravity, Urine: 1.01

## 2010-11-10 LAB — GC/CHLAMYDIA PROBE AMP, GENITAL
Chlamydia, DNA Probe: NEGATIVE
GC Probe Amp, Genital: NEGATIVE

## 2010-11-10 LAB — WET PREP, GENITAL: Trich, Wet Prep: NONE SEEN

## 2010-11-13 LAB — COMPREHENSIVE METABOLIC PANEL
BUN: 9
CO2: 27
Chloride: 108
Creatinine, Ser: 0.6
GFR calc non Af Amer: >60
Glucose, Bld: 98
Total Bilirubin: 0.7

## 2010-11-13 LAB — CBC
HCT: 38.7
MCV: 92.6
RBC: 4.18
WBC: 7.3

## 2010-11-13 LAB — DIFFERENTIAL
Basophils Absolute: 0
Eosinophils Relative: 5
Lymphocytes Relative: 36
Neutro Abs: 3.8
Neutrophils Relative %: 53

## 2010-11-13 LAB — URINALYSIS, ROUTINE W REFLEX MICROSCOPIC
Bilirubin Urine: NEGATIVE
Glucose, UA: NEGATIVE
Hgb urine dipstick: NEGATIVE
Specific Gravity, Urine: >1.03 — ABNORMAL HIGH

## 2010-11-13 LAB — WET PREP, GENITAL

## 2010-11-13 LAB — ABO/RH: ABO/RH(D): B POS

## 2010-11-13 LAB — HCG, QUANTITATIVE, PREGNANCY: hCG, Beta Chain, Quant, S: 58 — ABNORMAL HIGH

## 2010-12-18 ENCOUNTER — Other Ambulatory Visit (INDEPENDENT_AMBULATORY_CARE_PROVIDER_SITE_OTHER): Payer: Self-pay | Admitting: General Surgery

## 2010-12-22 ENCOUNTER — Ambulatory Visit: Admit: 2010-12-22 | Payer: 59 | Admitting: General Surgery

## 2010-12-22 SURGERY — EXCISION MASS
Anesthesia: General | Laterality: Left

## 2011-01-17 ENCOUNTER — Encounter (HOSPITAL_COMMUNITY): Payer: Self-pay | Admitting: Pharmacy Technician

## 2011-01-23 ENCOUNTER — Other Ambulatory Visit: Payer: Self-pay

## 2011-01-23 ENCOUNTER — Encounter (HOSPITAL_COMMUNITY)
Admission: RE | Admit: 2011-01-23 | Discharge: 2011-01-23 | Disposition: A | Discharge status: Home / Self Care | Payer: 59 | Source: Ambulatory Visit | Attending: General Surgery | Admitting: General Surgery

## 2011-01-23 ENCOUNTER — Encounter (HOSPITAL_COMMUNITY): Payer: Self-pay

## 2011-01-23 ENCOUNTER — Encounter (HOSPITAL_COMMUNITY)
Admission: RE | Admit: 2011-01-23 | Discharge: 2011-01-23 | Disposition: A | Discharge status: Home / Self Care | Payer: 59 | Source: Ambulatory Visit | Attending: Anesthesiology | Admitting: Anesthesiology

## 2011-01-23 HISTORY — DX: Gastro-esophageal reflux disease without esophagitis: K21.9

## 2011-01-23 LAB — BASIC METABOLIC PANEL
BUN: 7 mg/dL (ref 6–23)
Chloride: 105 mEq/L (ref 96–112)
GFR calc non Af Amer: >90 mL/min (ref >90)
Glucose, Bld: 82 mg/dL (ref 70–99)
Potassium: 3.7 mEq/L (ref 3.5–5.1)

## 2011-01-23 LAB — CBC
HCT: 40.2 % (ref 36.0–46.0)
Hemoglobin: 14.1 g/dL (ref 12.0–15.0)
MCHC: 35.1 g/dL (ref 30.0–36.0)

## 2011-01-23 LAB — HCG, SERUM, QUALITATIVE: Preg, Serum: NEGATIVE

## 2011-01-23 NOTE — Pre-Procedure Instructions (Signed)
20 LIAH MORR  01/23/2011   Your procedure is scheduled on:  02/02/11  Report to [facility] at 530 AM.  Call this number if you have problems the morning of surgery: [phone]   Remember:   Do not eat food:After Midnight.  May have clear liquids: up to 4 Hours before arrival.  Clear liquids include soda, tea, black coffee, apple or grape juice, broth.  Take these medicines the morning of surgery with A SIP OF WATER: none   Do not wear jewelry, make-up or nail polish.  Do not wear lotions, powders, or perfumes. You may wear deodorant.  Do not shave 48 hours prior to surgery.  Do not bring valuables to the hospital.  Contacts, dentures or bridgework may not be worn into surgery.  Leave suitcase in the car. After surgery it may be brought to your room.  For patients admitted to the hospital, checkout time is 11:00 AM the day of discharge.   Patients discharged the day of surgery will not be allowed to drive home.  Name and phone number of your driver: family  Special Instructions: CHG Shower Use Special Wash: 1/2 bottle night before surgery and 1/2 bottle morning of surgery.   Please read over the following fact sheets that you were given: Pain Booklet, MRSA Information and Surgical Site Infection Prevention

## 2011-01-24 ENCOUNTER — Telehealth (INDEPENDENT_AMBULATORY_CARE_PROVIDER_SITE_OTHER): Payer: Self-pay | Admitting: General Surgery

## 2011-01-24 ENCOUNTER — Other Ambulatory Visit (INDEPENDENT_AMBULATORY_CARE_PROVIDER_SITE_OTHER): Payer: Self-pay | Admitting: General Surgery

## 2011-01-24 NOTE — Progress Notes (Signed)
Spoke with Dr Dixon Boos office and requested orders.

## 2011-01-24 NOTE — Telephone Encounter (Signed)
[facility] called and needed orders for pt of Dr Lindie Spruce that is coming in for per op

## 2011-01-25 ENCOUNTER — Other Ambulatory Visit (INDEPENDENT_AMBULATORY_CARE_PROVIDER_SITE_OTHER): Payer: Self-pay | Admitting: General Surgery

## 2011-02-02 ENCOUNTER — Encounter (HOSPITAL_COMMUNITY)
Admission: RE | Disposition: A | Discharge status: Home / Self Care | Payer: Self-pay | Source: Ambulatory Visit | Attending: General Surgery

## 2011-02-02 ENCOUNTER — Encounter (HOSPITAL_COMMUNITY): Payer: Self-pay | Admitting: *Deleted

## 2011-02-02 ENCOUNTER — Ambulatory Visit (HOSPITAL_COMMUNITY)
Admission: RE | Admit: 2011-02-02 | Discharge: 2011-02-02 | Disposition: A | Discharge status: Home / Self Care | Payer: 59 | Source: Ambulatory Visit | Attending: General Surgery | Admitting: General Surgery

## 2011-02-02 ENCOUNTER — Other Ambulatory Visit (INDEPENDENT_AMBULATORY_CARE_PROVIDER_SITE_OTHER): Payer: Self-pay | Admitting: General Surgery

## 2011-02-02 ENCOUNTER — Encounter (HOSPITAL_COMMUNITY): Payer: Self-pay | Admitting: Certified Registered Nurse Anesthetist

## 2011-02-02 ENCOUNTER — Ambulatory Visit (HOSPITAL_BASED_OUTPATIENT_CLINIC_OR_DEPARTMENT_OTHER): Admission: RE | Admit: 2011-02-02 | Payer: 59 | Source: Ambulatory Visit | Admitting: General Surgery

## 2011-02-02 ENCOUNTER — Ambulatory Visit (HOSPITAL_COMMUNITY): Payer: 59 | Admitting: Certified Registered Nurse Anesthetist

## 2011-02-02 ENCOUNTER — Encounter (HOSPITAL_BASED_OUTPATIENT_CLINIC_OR_DEPARTMENT_OTHER): Admission: RE | Payer: Self-pay | Source: Ambulatory Visit

## 2011-02-02 DIAGNOSIS — D1739 Benign lipomatous neoplasm of skin and subcutaneous tissue of other sites: Secondary | ICD-10-CM

## 2011-02-02 DIAGNOSIS — Z01812 Encounter for preprocedural laboratory examination: Secondary | ICD-10-CM | POA: Insufficient documentation

## 2011-02-02 DIAGNOSIS — Z01818 Encounter for other preprocedural examination: Secondary | ICD-10-CM | POA: Insufficient documentation

## 2011-02-02 DIAGNOSIS — K219 Gastro-esophageal reflux disease without esophagitis: Secondary | ICD-10-CM | POA: Insufficient documentation

## 2011-02-02 DIAGNOSIS — D171 Benign lipomatous neoplasm of skin and subcutaneous tissue of trunk: Secondary | ICD-10-CM

## 2011-02-02 DIAGNOSIS — Z01810 Encounter for preprocedural cardiovascular examination: Secondary | ICD-10-CM | POA: Insufficient documentation

## 2011-02-02 DIAGNOSIS — I10 Essential (primary) hypertension: Secondary | ICD-10-CM | POA: Insufficient documentation

## 2011-02-02 HISTORY — PX: MASS EXCISION: SHX2000

## 2011-02-02 SURGERY — EXCISION MASS
Anesthesia: General | Site: Back | Laterality: Left | Wound class: Clean

## 2011-02-02 SURGERY — EXCISION MASS
Anesthesia: General

## 2011-02-02 MED ORDER — MEPERIDINE HCL 25 MG/ML IJ SOLN: 6.2500 mg | INTRAMUSCULAR | Status: DC | PRN

## 2011-02-02 MED ORDER — TRAMADOL HCL 50 MG PO TABS: 50.0000 mg | ORAL_TABLET | Freq: Four times a day (QID) | ORAL | Status: DC | PRN

## 2011-02-02 MED ORDER — NEOSTIGMINE METHYLSULFATE 1 MG/ML IJ SOLN
INTRAMUSCULAR | Status: DC | PRN
  Administered 2011-02-02: 3.5 mg via INTRAVENOUS

## 2011-02-02 MED ORDER — MIDAZOLAM HCL 5 MG/5ML IJ SOLN
INTRAMUSCULAR | Status: DC | PRN
  Administered 2011-02-02: 2 mg via INTRAVENOUS

## 2011-02-02 MED ORDER — HYDROMORPHONE HCL PF 1 MG/ML IJ SOLN: 0.2500 mg | INTRAMUSCULAR | Status: DC | PRN

## 2011-02-02 MED ORDER — FENTANYL CITRATE 0.05 MG/ML IJ SOLN
INTRAMUSCULAR | Status: DC | PRN
  Administered 2011-02-02: 100 ug via INTRAVENOUS
  Administered 2011-02-02 (×2): 50 ug via INTRAVENOUS

## 2011-02-02 MED ORDER — BUPIVACAINE-EPINEPHRINE PF 0.25-1:200000 % IJ SOLN
INTRAMUSCULAR | Status: DC | PRN
  Administered 2011-02-02: 10 mL

## 2011-02-02 MED ORDER — PROMETHAZINE HCL 25 MG/ML IJ SOLN: 6.2500 mg | INTRAMUSCULAR | Status: DC | PRN

## 2011-02-02 MED ORDER — ONDANSETRON HCL 4 MG/2ML IJ SOLN
INTRAMUSCULAR | Status: DC | PRN
  Administered 2011-02-02: 4 mg via INTRAVENOUS

## 2011-02-02 MED ORDER — MIDAZOLAM HCL 2 MG/2ML IJ SOLN: 0.5000 mg | Freq: Once | INTRAMUSCULAR | Status: DC | PRN

## 2011-02-02 MED ORDER — ROCURONIUM BROMIDE 100 MG/10ML IV SOLN
INTRAVENOUS | Status: DC | PRN
  Administered 2011-02-02: 50 mg via INTRAVENOUS

## 2011-02-02 MED ORDER — LACTATED RINGERS IV SOLN: INTRAVENOUS | Status: DC

## 2011-02-02 MED ORDER — LACTATED RINGERS IV SOLN
INTRAVENOUS | Status: DC | PRN
  Administered 2011-02-02: 07:00:00 via INTRAVENOUS

## 2011-02-02 MED ORDER — GLYCOPYRROLATE 0.2 MG/ML IJ SOLN
INTRAMUSCULAR | Status: DC | PRN
  Administered 2011-02-02: .6 mg via INTRAVENOUS

## 2011-02-02 MED ORDER — 0.9 % SODIUM CHLORIDE (POUR BTL) OPTIME
TOPICAL | Status: DC | PRN
  Administered 2011-02-02: 1000 mL

## 2011-02-02 MED ORDER — MORPHINE SULFATE 2 MG/ML IJ SOLN: 0.0500 mg/kg | INTRAMUSCULAR | Status: DC | PRN

## 2011-02-02 MED ORDER — PROPOFOL 10 MG/ML IV EMUL
INTRAVENOUS | Status: DC | PRN
  Administered 2011-02-02: 150 mg via INTRAVENOUS

## 2011-02-02 SURGICAL SUPPLY — 48 items
BLADE SURG 10 STRL SS (BLADE) ×2 IMPLANT
BLADE SURG 15 STRL LF DISP TIS (BLADE) ×1 IMPLANT
BLADE SURG 15 STRL SS (BLADE) ×1
CANISTER SUCTION 2500CC (MISCELLANEOUS) ×2 IMPLANT
CHLORAPREP W/TINT 26ML (MISCELLANEOUS) ×2 IMPLANT
CLEANER TIP ELECTROSURG 2X2 (MISCELLANEOUS) ×2 IMPLANT
CLOTH BEACON ORANGE TIMEOUT ST (SAFETY) ×2 IMPLANT
COVER SURGICAL LIGHT HANDLE (MISCELLANEOUS) ×2 IMPLANT
DECANTER SPIKE VIAL GLASS SM (MISCELLANEOUS) IMPLANT
DERMABOND ADVANCED (GAUZE/BANDAGES/DRESSINGS) ×1
DERMABOND ADVANCED .7 DNX12 (GAUZE/BANDAGES/DRESSINGS) ×1 IMPLANT
DRAPE LAPAROTOMY T 102X78X121 (DRAPES) ×2 IMPLANT
DRAPE UTILITY 15X26 W/TAPE STR (DRAPE) ×4 IMPLANT
ELECT REM PT RETURN 9FT ADLT (ELECTROSURGICAL) ×2
ELECTRODE REM PT RTRN 9FT ADLT (ELECTROSURGICAL) ×1 IMPLANT
GAUZE SPONGE 4X4 16PLY XRAY LF (GAUZE/BANDAGES/DRESSINGS) ×2 IMPLANT
GLOVE BIO SURGEON STRL SZ 6.5 (GLOVE) ×2 IMPLANT
GLOVE BIO SURGEON STRL SZ7.5 (GLOVE) ×2 IMPLANT
GLOVE BIOGEL PI IND STRL 6.5 (GLOVE) ×1 IMPLANT
GLOVE BIOGEL PI IND STRL 7.5 (GLOVE) ×1 IMPLANT
GLOVE BIOGEL PI IND STRL 8 (GLOVE) ×1 IMPLANT
GLOVE BIOGEL PI INDICATOR 6.5 (GLOVE) ×1
GLOVE BIOGEL PI INDICATOR 7.5 (GLOVE) ×1
GLOVE BIOGEL PI INDICATOR 8 (GLOVE) ×1
GLOVE ECLIPSE 7.5 STRL STRAW (GLOVE) ×2 IMPLANT
GOWN STRL NON-REIN LRG LVL3 (GOWN DISPOSABLE) ×6 IMPLANT
KIT BASIN OR (CUSTOM PROCEDURE TRAY) ×2 IMPLANT
KIT ROOM TURNOVER OR (KITS) ×2 IMPLANT
NEEDLE HYPO 25X1 1.5 SAFETY (NEEDLE) ×2 IMPLANT
NS IRRIG 1000ML POUR BTL (IV SOLUTION) ×2 IMPLANT
PACK SURGICAL SETUP 50X90 (CUSTOM PROCEDURE TRAY) ×2 IMPLANT
PAD ARMBOARD 7.5X6 YLW CONV (MISCELLANEOUS) ×6 IMPLANT
PENCIL BUTTON HOLSTER BLD 10FT (ELECTRODE) ×2 IMPLANT
SPECIMEN JAR SMALL (MISCELLANEOUS) ×2 IMPLANT
SPONGE GAUZE 4X4 12PLY (GAUZE/BANDAGES/DRESSINGS) IMPLANT
SPONGE LAP 18X18 X RAY DECT (DISPOSABLE) ×2 IMPLANT
STRIP CLOSURE SKIN 1/2X4 (GAUZE/BANDAGES/DRESSINGS) ×2 IMPLANT
SUT MNCRL AB 4-0 PS2 18 (SUTURE) ×2 IMPLANT
SUT VIC AB 3-0 SH 27 (SUTURE) ×1
SUT VIC AB 3-0 SH 27X BRD (SUTURE) ×1 IMPLANT
SUT VIC AB 4-0 SH 27 (SUTURE) ×1
SUT VIC AB 4-0 SH 27XBRD (SUTURE) ×1 IMPLANT
SYR BULB 3OZ (MISCELLANEOUS) ×2 IMPLANT
SYR CONTROL 10ML LL (SYRINGE) ×2 IMPLANT
TOWEL OR 17X24 6PK STRL BLUE (TOWEL DISPOSABLE) IMPLANT
TOWEL OR 17X26 10 PK STRL BLUE (TOWEL DISPOSABLE) ×2 IMPLANT
TUBE CONNECTING 12X1/4 (SUCTIONS) ×2 IMPLANT
YANKAUER SUCT BULB TIP NO VENT (SUCTIONS) ×2 IMPLANT

## 2011-02-02 NOTE — H&P (Signed)
  Bonnie Jones   09/26/2010 9:20 AM Office Visit  MRN: [id number]   Description: 30 year old female  Provider: Jetty Duhamel, MD  Department: [facility]        Diagnoses     Lipoma of back   - Primary    214.8      Reason for Visit     Other    new pt- eval of lipoma on back         Vitals - Last Recorded       BP Pulse Temp Ht Wt BMI    136/82  58  97.4 F (36.3 C)  5' 5.5" (1.664 m)  159 lb 2 oz (72.179 kg)  26.08 kg/m2       Progress Notes     [name] III,Oris Calmes O, MD  09/26/2010  9:41 AM  Signed HPI The patient comes in with a 6 year history of a left upper back lump which is intermittently symptomatic with discomfort especially when she leans against a Hypaque tears. She's had no fevers or chills or redness associated with it. She was told there was likely a lipoma and I agree that the chances are that it is a lipoma possibly submuscular.   PE On examination her pulse is 58 blood pressure 136/82 and she is afebrile with a temperature 97.77F.   Focus my examination on the patient's left upper back where she has a 4 cm soft somewhat mobile mass in the upper back. As in the upper portion of the trapezius muscle on the back.    Studiy review There are no current studies to review   Assessment Likely 4 cm lipoma and/or cyst of the left upper back in the trapezius area.   Plan The plan is to remove this under general anesthesia as an outpatient in day surgery. The patient understands risks and benefits she understands also that she were replaced the lump with a scar and she is well aware that this will be done as an outpatient.    Marta Lamas. Gae Bon, MD, FACS [phone] [facility]

## 2011-02-02 NOTE — Anesthesia Postprocedure Evaluation (Signed)
  Anesthesia Post-op Note  Patient: Bonnie Jones  Procedure(s) Performed:  EXCISION MASS - Excision of LEFT UPPER BACK MASS  Patient Location: PACU  Anesthesia Type: General  Level of Consciousness: awake  Airway and Oxygen Therapy: Patient Spontanous Breathing  Post-op Pain: mild  Post-op Assessment: Post-op Vital signs reviewed  Post-op Vital Signs: stable  Complications: No apparent anesthesia complications

## 2011-02-02 NOTE — Transfer of Care (Signed)
Immediate Anesthesia Transfer of Care Note  Patient: Bonnie Jones  Procedure(s) Performed:  EXCISION MASS - Excision of LEFT UPPER BACK MASS  Patient Location: PACU  Anesthesia Type: General  Level of Consciousness: awake, alert  and oriented  Airway & Oxygen Therapy: Patient Spontanous Breathing and Patient connected to nasal cannula oxygen  Post-op Assessment: Report given to PACU RN and Post -op Vital signs reviewed and stable  Post vital signs: Reviewed and stable  Complications: No apparent anesthesia complications

## 2011-02-02 NOTE — Anesthesia Procedure Notes (Signed)
Procedure Name: Intubation Date/Time: 02/02/2011 7:25 AM Performed by: Delbert Harness Pre-anesthesia Checklist: Patient identified, Emergency Drugs available, Suction available, Patient being monitored and Timeout performed Patient Re-evaluated:Patient Re-evaluated prior to inductionOxygen Delivery Method: Circle System Utilized Preoxygenation: Pre-oxygenation with 100% oxygen Intubation Type: IV induction Ventilation: Mask ventilation without difficulty Laryngoscope Size: Mac and 3 Grade View: Grade II Tube type: Oral Tube size: 7.0 mm Number of attempts: 1 Airway Equipment and Method: stylet Placement Confirmation: ETT inserted through vocal cords under direct vision,  positive ETCO2 and breath sounds checked- equal and bilateral Secured at: 21 cm Tube secured with: Tape Dental Injury: Teeth and Oropharynx as per pre-operative assessment

## 2011-02-02 NOTE — Progress Notes (Signed)
Report given to Renee Rival RN as primary care giver.

## 2011-02-02 NOTE — Op Note (Signed)
OPERATIVE REPORT  DATE OF OPERATION: 02/02/2011  PATIENT:  Bonnie Jones  30 y.o. female  PRE-OPERATIVE DIAGNOSIS:  left upper back mass  POST-OPERATIVE DIAGNOSIS: 3.5 x 6.5 cm left upper back lipoma  PROCEDURE:  Procedure(s): EXCISION MASS  SURGEON:  Surgeon(s): Cherylynn Ridges III, MD  ASSISTANT: None  ANESTHESIA:   general  EBL: <20 ml  BLOOD ADMINISTERED: none  DRAINS: none   SPECIMEN:  Source of Specimen:  left upper back lipoma  COUNTS CORRECT:  YES  PROCEDURE DETAILS: The patient was taken to the operating room on a gurney in the supine position.  After she was intubated, she was placed on the operating table in the prone position.  After a proper time out was performed identifying the patient and the procedure to be performed, we marked the site of the lipoma just to the left of midline in the upper back.  A # 15 blade was used to make a 3.5 cm incision.  We dissected down to the deep muscle fascia inderneath which the lipoma originated.  We dissected out this 6.5 x 3.5 x 1.0 cm lipoma which had its origin in the deep musculature of the back.  Electrocautery was used to come across its vascular base.  Once it was removed we closed with three layes, the two deepest with dyed 4-0 Vicryl, and the skin with subcuticular 4-0 Monocryl.  Dermabond, Steri-strips and Tagaderm were used to complete the dressing.  All counts were correct. Marland Kitchen PATIENT DISPOSITION:  Stable in PACU, then home through short stay.   Chasen Mendell III,Crystallynn Noorani O 12/21/20128:18 AM

## 2011-02-02 NOTE — Preoperative (Signed)
Beta Blockers   Reason not to administer Beta Blockers:Not Applicable 

## 2011-02-02 NOTE — Anesthesia Preprocedure Evaluation (Signed)
Anesthesia Evaluation  Patient identified by MRN, date of birth, ID band Patient awake    Reviewed: Allergy & Precautions, H&P , NPO status , Patient's Chart, lab work & pertinent test results  Airway Mallampati: II TM Distance: >3 FB Neck ROM: Full    Dental  (+) Teeth Intact and Dental Advisory Given   Pulmonary  Asthma as a child   Pulmonary exam normal       Cardiovascular hypertension, Pt. on medications     Neuro/Psych Negative Neurological ROS  Negative Psych ROS   GI/Hepatic Neg liver ROS, GERD-  Controlled,  Endo/Other  Negative Endocrine ROS  Renal/GU negative Renal ROS     Musculoskeletal   Abdominal Normal abdominal exam  (+)   Peds  Hematology negative hematology ROS (+)   Anesthesia Other Findings   Reproductive/Obstetrics                           Anesthesia Physical Anesthesia Plan  ASA: II  Anesthesia Plan: General   Post-op Pain Management:    Induction: Intravenous  Airway Management Planned: Oral ETT  Additional Equipment:   Intra-op Plan:   Post-operative Plan: Extubation in OR  Informed Consent: I have reviewed the patients History and Physical, chart, labs and discussed the procedure including the risks, benefits and alternatives for the proposed anesthesia with the patient or authorized representative who has indicated his/her understanding and acceptance.   Dental advisory given  Plan Discussed with: CRNA, Anesthesiologist and Surgeon  Anesthesia Plan Comments:         Anesthesia Quick Evaluation

## 2011-02-02 NOTE — Interval H&P Note (Signed)
History and Physical Interval Note:  02/02/2011 7:10 AM  Bonnie Jones  has presented today for surgery, with the diagnosis of left upper back cyst  The various methods of treatment have been discussed with the patient and family. After consideration of risks, benefits and other options for treatment, the patient has consented to  Procedure(s): EXCISION MASS as a surgical intervention .  The patients' history has been reviewed, patient examined, no change in status, stable for surgery.  I have reviewed the patients' chart and labs.  Questions were answered to the patient's satisfaction.     Dencil Cayson III,Shallyn Constancio O  This mass is more towards the midline and slightly to the left.  Will need to be in the prone position.  No other changes in her condition.

## 2011-02-08 ENCOUNTER — Encounter (HOSPITAL_COMMUNITY): Payer: Self-pay | Admitting: General Surgery

## 2011-02-12 ENCOUNTER — Telehealth (INDEPENDENT_AMBULATORY_CARE_PROVIDER_SITE_OTHER): Payer: Self-pay

## 2011-02-12 NOTE — Telephone Encounter (Signed)
C/o Tramadol  50 mg not working very well.  Pain level 10- Excised lipoma on back 02/02/2011- also taking OTC pain meds with Tramadol.

## 2011-02-12 NOTE — Telephone Encounter (Signed)
Hydrocodone 5/32r, one tab PO every 4-6 hours for pain #10- ok Dr. Carolynne Edouard-  Called to [facility] [phone].

## 2011-02-14 ENCOUNTER — Encounter (INDEPENDENT_AMBULATORY_CARE_PROVIDER_SITE_OTHER): Payer: 59 | Admitting: General Surgery

## 2011-02-27 ENCOUNTER — Ambulatory Visit (INDEPENDENT_AMBULATORY_CARE_PROVIDER_SITE_OTHER): Payer: 59 | Admitting: General Surgery

## 2011-02-27 ENCOUNTER — Encounter (INDEPENDENT_AMBULATORY_CARE_PROVIDER_SITE_OTHER): Payer: Self-pay | Admitting: General Surgery

## 2011-02-27 VITALS — BP 114/74 | Temp 98.2°F | Ht 65.5 in | Wt 160.2 lb

## 2011-02-27 DIAGNOSIS — Z09 Encounter for follow-up examination after completed treatment for conditions other than malignant neoplasm: Secondary | ICD-10-CM

## 2011-02-27 NOTE — Progress Notes (Signed)
HPI The patient is still complaining of pain at the 4-5 cm transverse incision line where lipoma was removed.  PE On examination today she has no swelling no redness some slight tenderness when you palpate the area but it seems to be out of proportion to her wound.  Studiy review There are no studies to review.  Assessment Postoperative pain out of proportion to physical findings. I am unsure why the patient is continuing to anastomose discomfort although the lipoma was in a submuscular position she should not be continued to have as much discomfort.  Plan The patient has been released to go back to work. Her wound is now to require reexploration nor any further studies at this time. If she should continue to have discomfort in the next several months and perhaps an MRI to look for any irritation of the nerves would be necessary. Right now she has normal range of motion in her left arm and left shoulder

## 2011-05-27 ENCOUNTER — Emergency Department (HOSPITAL_COMMUNITY)
Admission: EM | Admit: 2011-05-27 | Discharge: 2011-05-27 | Disposition: A | Discharge status: Home / Self Care | Payer: 59 | Attending: Emergency Medicine | Admitting: Emergency Medicine

## 2011-05-27 ENCOUNTER — Encounter (HOSPITAL_COMMUNITY): Payer: Self-pay | Admitting: Emergency Medicine

## 2011-05-27 DIAGNOSIS — L089 Local infection of the skin and subcutaneous tissue, unspecified: Secondary | ICD-10-CM | POA: Insufficient documentation

## 2011-05-27 DIAGNOSIS — K219 Gastro-esophageal reflux disease without esophagitis: Secondary | ICD-10-CM | POA: Insufficient documentation

## 2011-05-27 DIAGNOSIS — I10 Essential (primary) hypertension: Secondary | ICD-10-CM | POA: Insufficient documentation

## 2011-05-27 DIAGNOSIS — W57XXXA Bitten or stung by nonvenomous insect and other nonvenomous arthropods, initial encounter: Secondary | ICD-10-CM

## 2011-05-27 DIAGNOSIS — Y999 Unspecified external cause status: Secondary | ICD-10-CM | POA: Insufficient documentation

## 2011-05-27 DIAGNOSIS — S60469A Insect bite (nonvenomous) of unspecified finger, initial encounter: Secondary | ICD-10-CM | POA: Insufficient documentation

## 2011-05-27 MED ORDER — IBUPROFEN 600 MG PO TABS: 600.0000 mg | ORAL_TABLET | Freq: Four times a day (QID) | ORAL | Status: AC | PRN

## 2011-05-27 MED ORDER — DOXYCYCLINE HYCLATE 100 MG PO CAPS: 100.0000 mg | ORAL_CAPSULE | Freq: Two times a day (BID) | ORAL | Status: AC

## 2011-05-27 NOTE — ED Notes (Signed)
Pt c/o ?insect bite to chest, red area noted. Pt states she feels like her throat is swelling.

## 2011-05-27 NOTE — ED Notes (Signed)
J. Idol at bedside 

## 2011-05-27 NOTE — Discharge Instructions (Signed)
Insect Bite Mosquitoes, flies, fleas, bedbugs, and many other insects can bite. Insect bites are different from insect stings. A sting is when venom is injected into the skin. Some insect bites can transmit infectious diseases. SYMPTOMS  Insect bites usually turn red, swell, and itch for 2 to 4 days. They often go away on their own. TREATMENT  Your caregiver may prescribe antibiotic medicines if a bacterial infection develops in the bite. HOME CARE INSTRUCTIONS  Do not scratch the bite area.   Keep the bite area clean and dry. Wash the bite area thoroughly with soap and water.   Put ice or cool compresses on the bite area.   Put ice in a plastic bag.   Place a towel between your skin and the bag.   Leave the ice on for 20 minutes, 4 times a day for the first 2 to 3 days, or as directed.   You may apply a baking soda paste, cortisone cream, or calamine lotion to the bite area as directed by your caregiver. This can help reduce itching and swelling.   Only take over-the-counter or prescription medicines as directed by your caregiver.   If you are given antibiotics, take them as directed. Finish them even if you start to feel better.  You may need a tetanus shot if:  You cannot remember when you had your last tetanus shot.   You have never had a tetanus shot.   The injury broke your skin.  If you get a tetanus shot, your arm may swell, get red, and feel warm to the touch. This is common and not a problem. If you need a tetanus shot and you choose not to have one, there is a rare chance of getting tetanus. Sickness from tetanus can be serious. SEEK IMMEDIATE MEDICAL CARE IF:   You have increased pain, redness, or swelling in the bite area.   You see a red line on the skin coming from the bite.   You have a fever.   You have joint pain.   You have a headache or neck pain.   You have unusual weakness.   You have a rash.   You have chest pain or shortness of breath.   You  have abdominal pain, nausea, or vomiting.   You feel unusually tired or sleepy.  MAKE SURE YOU:   Understand these instructions.   Will watch your condition.   Will get help right away if you are not doing well or get worse.  Document Released: 03/08/2004 Document Revised: 01/18/2011 Document Reviewed: 08/30/2010 [facility] Patient Information 2012 [place], [place].  Use the antibiotic as prescribed until completed.  Avoid rubbing or squeezing this area as squeezing the wound can make infection worse.  Use warm compresses or warm shower soak as described for 10 minutes several times daily.  You may use ibuprofen which will help with pain and swelling.  Get rechecked by your physician if not improving over the next several days.

## 2011-05-28 NOTE — ED Provider Notes (Signed)
History     CSN: [id number]  Arrival date & time 05/27/11  1036   First MD Initiated Contact with Patient 05/27/11 1046      Chief Complaint  Patient presents with  . Insect Bite    (Consider location/radiation/quality/duration/timing/severity/associated sxs/prior treatment) HPI Comments: Patient was bit by an unknown insect 3 days ago and has persistent swelling and redness on her upper mid chest with a tight sensation on her anterior neck.  She denies fevers,  Shortness of breath,  Throat or mouth swelling or pain.  Patient is a 31 y.o. female presenting with rash. The history is provided by the patient.  Rash  This is a new problem. Episode onset: 3 days ago. The problem has not changed since onset.The problem is associated with an insect bite/sting. There has been no fever. The pain is at a severity of 2/10. The pain has been constant since onset. Associated symptoms include pain. Pertinent negatives include no blisters, no itching and no weeping. She has tried nothing for the symptoms.    Past Medical History  Diagnosis Date  . Hypertension   . Weight decrease   . Asthma     as child  . GERD (gastroesophageal reflux disease)     Past Surgical History  Procedure Date  . Tonsillectomy and adenoidectomy   . Mass excision 02/02/2011    Procedure: EXCISION MASS;  Surgeon: Jetty Duhamel, MD;  Location: [facility] OR;  Service: General;  Laterality: Left;  Excision of LEFT UPPER BACK MASS    Family History  Problem Relation Age of Onset  . Hypertension Mother   . Heart disease Mother     History  Substance Use Topics  . Smoking status: Current Everyday Smoker -- 0.5 packs/day for 10 years    Types: Cigarettes  . Smokeless tobacco: Not on file  . Alcohol Use: 1.5 oz/week    3 drink(s) per week    OB History    Grav Para Term Preterm Abortions TAB SAB Ect Mult Living                  Review of Systems  Constitutional: Negative for fever.  HENT: Negative for  congestion, sore throat, trouble swallowing, neck pain and voice change.   Eyes: Negative.   Respiratory: Negative for cough, choking, chest tightness, shortness of breath, wheezing and stridor.   Cardiovascular: Negative for chest pain.  Gastrointestinal: Negative for nausea and abdominal pain.  Genitourinary: Negative.   Musculoskeletal: Negative for joint swelling and arthralgias.  Skin: Positive for rash. Negative for itching.  Neurological: Negative for dizziness, weakness, light-headedness, numbness and headaches.  Hematological: Negative.   Psychiatric/Behavioral: Negative.     Allergies  Review of patient's allergies indicates no known allergies.  Home Medications   Current Outpatient Rx  Name Route Sig Dispense Refill  . DOXYCYCLINE HYCLATE 100 MG PO CAPS Oral Take 1 capsule (100 mg total) by mouth 2 (two) times daily. 20 capsule 0  . HYDROCHLOROTHIAZIDE 25 MG PO TABS Oral Take 25 mg by mouth daily.      . IBUPROFEN 600 MG PO TABS Oral Take 1 tablet (600 mg total) by mouth every 6 (six) hours as needed for pain. 30 tablet 0  . PHENTERMINE HCL 37.5 MG PO TABS Oral Take 37.5 mg by mouth Daily.     . TRAMADOL HCL 50 MG PO TABS Oral Take 1 tablet (50 mg total) by mouth every 6 (six) hours as needed for pain. Maximum  dose= 8 tablets per day 30 tablet 0    BP 128/58  Pulse 81  Temp 98.2 F (36.8 C)  Resp 18  Ht 5' 5.5" (1.664 m)  Wt 158 lb (71.668 kg)  BMI 25.89 kg/m2  SpO2 100%  Physical Exam  Nursing note and vitals reviewed. Constitutional: She is oriented to person, place, and time. She appears well-developed and well-nourished.  HENT:  Head: Normocephalic and atraumatic.  Eyes: Conjunctivae are normal.  Neck: Normal range of motion.  Cardiovascular: Normal rate, regular rhythm, normal heart sounds and intact distal pulses.   Pulmonary/Chest: Effort normal and breath sounds normal. She has no wheezes. She has no rales. She exhibits no tenderness.  Abdominal: Soft.  Bowel sounds are normal. There is no tenderness.  Musculoskeletal: Normal range of motion.  Lymphadenopathy:    She has no cervical adenopathy.    She has no axillary adenopathy.       Right: No supraclavicular adenopathy present.       Left: No supraclavicular adenopathy present.  Neurological: She is alert and oriented to person, place, and time.  Skin: Skin is warm and dry. There is erythema.       Single raised 1 cm papule upper mid chest without fluctuance.  Surrounding 1 cm erythema without streaking.    Psychiatric: She has a normal mood and affect.    ED Course  Procedures (including critical care time)   Labs Reviewed  POCT PREGNANCY, URINE  LAB REPORT - SCANNED   No results found.   1. Infected insect bite       MDM  Doxycycline prescribed.  Pt with no respiratory distress.  No wheezing or stridor.  Exam c/w with infected insect bite,  Possible early cellulitis.        Candis Musa, PA 05/28/11 2141

## 2011-05-28 NOTE — ED Provider Notes (Signed)
Medical screening examination/treatment/procedure(s) were performed by non-physician practitioner and as supervising physician I was immediately available for consultation/collaboration.   Marlinda Miranda, MD 05/28/11 2242 

## 2011-07-15 ENCOUNTER — Encounter (HOSPITAL_COMMUNITY): Payer: Self-pay | Admitting: *Deleted

## 2011-07-15 ENCOUNTER — Emergency Department (HOSPITAL_COMMUNITY)
Admission: EM | Admit: 2011-07-15 | Discharge: 2011-07-15 | Disposition: A | Discharge status: Home / Self Care | Payer: 59 | Attending: Emergency Medicine | Admitting: Emergency Medicine

## 2011-07-15 DIAGNOSIS — N390 Urinary tract infection, site not specified: Secondary | ICD-10-CM | POA: Insufficient documentation

## 2011-07-15 DIAGNOSIS — I10 Essential (primary) hypertension: Secondary | ICD-10-CM | POA: Insufficient documentation

## 2011-07-15 DIAGNOSIS — F17200 Nicotine dependence, unspecified, uncomplicated: Secondary | ICD-10-CM | POA: Insufficient documentation

## 2011-07-15 DIAGNOSIS — K219 Gastro-esophageal reflux disease without esophagitis: Secondary | ICD-10-CM | POA: Insufficient documentation

## 2011-07-15 LAB — URINE MICROSCOPIC-ADD ON

## 2011-07-15 LAB — URINALYSIS, ROUTINE W REFLEX MICROSCOPIC
Glucose, UA: NEGATIVE mg/dL
Ketones, ur: NEGATIVE mg/dL
Leukocytes, UA: NEGATIVE
Protein, ur: 100 mg/dL — AB
pH: 6.5 (ref 5.0–8.0)

## 2011-07-15 LAB — POCT PREGNANCY, URINE: Preg Test, Ur: NEGATIVE

## 2011-07-15 MED ORDER — SULFAMETHOXAZOLE-TRIMETHOPRIM 800-160 MG PO TABS: 1.0000 | ORAL_TABLET | Freq: Two times a day (BID) | ORAL | Status: AC

## 2011-07-15 NOTE — ED Provider Notes (Signed)
History    This chart was scribed for [employer]. Rubin Payor, MD, MD by Smitty Pluck. The patient was seen in room APA06 and the patient's care was started at 5:50PM.   CSN: [id number]  Arrival date & time 07/15/11  1707   First MD Initiated Contact with Patient 07/15/11 1734      Chief Complaint  Patient presents with  . Abdominal Pain  . Urinary Tract Infection    (Consider location/radiation/quality/duration/timing/severity/associated sxs/prior treatment) Patient is a 31 y.o. female presenting with abdominal pain and urinary tract infection. The history is provided by the patient.  Abdominal Pain The primary symptoms of the illness include abdominal pain and dysuria. The primary symptoms of the illness do not include fever, nausea or vomiting.  The dysuria is associated with hematuria.  Additional symptoms associated with the illness include hematuria. Symptoms associated with the illness do not include chills.  Urinary Tract Infection Associated symptoms include abdominal pain.   CAMDYNN MARANTO is a 31 y.o. female who presents to the Emergency Department complaining of moderate lower abdominal pain onset UTI onset today. Pt reports that her menstrual cycles are irregular but LMP was 1 month ago and lasted 3 days. She reports taking diet pills that usually cause abdominal pain. Has past hx of UTI. Symptoms have sudden onset. Reports dysuria and blood in urine but unsure if it is from vagina or bladder. Symptoms have been constant since onset without radiation. Reports taking two at home pregnancy tests 1 day ago with 1 positive results and 1 negative result.   Past Medical History  Diagnosis Date  . Hypertension   . Weight decrease   . Asthma     as child  . GERD (gastroesophageal reflux disease)     Past Surgical History  Procedure Date  . Tonsillectomy and adenoidectomy   . Mass excision 02/02/2011    Procedure: EXCISION MASS;  Surgeon: Jetty Duhamel, MD;  Location: [facility]  OR;  Service: General;  Laterality: Left;  Excision of LEFT UPPER BACK MASS    Family History  Problem Relation Age of Onset  . Hypertension Mother   . Heart disease Mother     History  Substance Use Topics  . Smoking status: Current Everyday Smoker -- 0.5 packs/day for 10 years    Types: Cigarettes  . Smokeless tobacco: Not on file  . Alcohol Use: 1.5 oz/week    3 drink(s) per week    OB History    Grav Para Term Preterm Abortions TAB SAB Ect Mult Living                  Review of Systems  Constitutional: Negative for fever and chills.  Gastrointestinal: Positive for abdominal pain. Negative for nausea and vomiting.  Genitourinary: Positive for dysuria and hematuria.    Allergies  Review of patient's allergies indicates no known allergies.  Home Medications   Current Outpatient Rx  Name Route Sig Dispense Refill  . HYDROCHLOROTHIAZIDE 25 MG PO TABS Oral Take 25 mg by mouth daily.      Marland Kitchen PHENTERMINE HCL 37.5 MG PO TABS Oral Take 37.5 mg by mouth Daily.     . SULFAMETHOXAZOLE-TRIMETHOPRIM 800-160 MG PO TABS Oral Take 1 tablet by mouth 2 (two) times daily. 6 tablet 0    BP 116/49  Pulse 73  Temp 98.5 F (36.9 C)  Resp 20  Ht 5\' 6"  (1.676 m)  Wt 164 lb (74.39 kg)  BMI 26.47 kg/m2  SpO2  100%  LMP 06/13/2011  Physical Exam  Nursing note and vitals reviewed. Constitutional: She is oriented to person, place, and time. She appears well-developed and well-nourished. No distress.  HENT:  Head: Normocephalic and atraumatic.  Eyes: Conjunctivae are normal. Pupils are equal, round, and reactive to light.  Cardiovascular: Normal rate, regular rhythm and normal heart sounds.   Pulmonary/Chest: Effort normal and breath sounds normal. No respiratory distress.  Abdominal: Soft. She exhibits no distension. There is no tenderness. There is no rebound and no guarding.  Neurological: She is alert and oriented to person, place, and time.  Skin: Skin is warm and dry.    Psychiatric: She has a normal mood and affect. Her behavior is normal.    ED Course  Procedures (including critical care time) DIAGNOSTIC STUDIES: Oxygen Saturation is 100% on room air, normal by my interpretation.    COORDINATION OF CARE: 5:55PM EDP discusses pt ED treatment with pt. 6:20PM EDP rechecks pt and discusses pt lab results. Pt is ready for discharge.    Labs Reviewed  URINALYSIS, ROUTINE W REFLEX MICROSCOPIC - Abnormal; Notable for the following:    APPearance CLOUDY (*)    Specific Gravity, Urine >1.030 (*)    Hgb urine dipstick LARGE (*)    Protein, ur 100 (*)    All other components within normal limits  URINE MICROSCOPIC-ADD ON - Abnormal; Notable for the following:    Squamous Epithelial / LPF FEW (*)    Bacteria, UA MANY (*)    All other components within normal limits  POCT PREGNANCY, URINE   No results found.   1. UTI (urinary tract infection)       MDM  Patient with dysuria and pelvic pain. She had one positive of one negative pregnancy test at home. She has a negative pregnancy test here. She has a clear urinary tract infection on urinalysis. She was discharged home with antibiotics. Patient was informed of the results of pregnancy test, which is also informed that it could be too early to show. She'll followup as needed.    I personally performed the services described in this documentation, which was scribed in my presence. The recorded information has been reviewed and considered.      Juliet Rude. Rubin Payor, MD 07/15/11 Paulo Fruit

## 2011-07-15 NOTE — ED Notes (Signed)
Pt c/o lower abd pain, pain with urination, pt states that she took two pregnancy test yesterday , one was positive, one was negative, pain started today

## 2011-07-15 NOTE — Discharge Instructions (Signed)

## 2011-07-15 NOTE — ED Notes (Signed)
Pt c/o dysuria and lower abd pain. Pt also want a urine pregnancy done.

## 2012-01-24 LAB — OB RESULTS CONSOLE VARICELLA ZOSTER ANTIBODY, IGG: Varicella: IMMUNE

## 2012-01-24 LAB — OB RESULTS CONSOLE ABO/RH: RH Type: POSITIVE

## 2012-01-24 LAB — OB RESULTS CONSOLE HEPATITIS B SURFACE ANTIGEN: Hepatitis B Surface Ag: NEGATIVE

## 2012-01-24 LAB — OB RESULTS CONSOLE ANTIBODY SCREEN: Antibody Screen: NEGATIVE

## 2012-01-24 LAB — CYSTIC FIBROSIS DIAGNOSTIC STUDY: Interpretation-CFDNA:: NEGATIVE

## 2012-02-10 ENCOUNTER — Emergency Department (HOSPITAL_COMMUNITY)
Admission: EM | Admit: 2012-02-10 | Discharge: 2012-02-10 | Discharge status: Against Medical Advice | Payer: Medicaid Other | Attending: Emergency Medicine | Admitting: Emergency Medicine

## 2012-02-10 ENCOUNTER — Encounter (HOSPITAL_COMMUNITY): Payer: Self-pay

## 2012-02-10 DIAGNOSIS — J45909 Unspecified asthma, uncomplicated: Secondary | ICD-10-CM | POA: Insufficient documentation

## 2012-02-10 DIAGNOSIS — R109 Unspecified abdominal pain: Secondary | ICD-10-CM | POA: Insufficient documentation

## 2012-02-10 DIAGNOSIS — F17200 Nicotine dependence, unspecified, uncomplicated: Secondary | ICD-10-CM | POA: Insufficient documentation

## 2012-02-10 DIAGNOSIS — I10 Essential (primary) hypertension: Secondary | ICD-10-CM | POA: Insufficient documentation

## 2012-02-10 DIAGNOSIS — O9989 Other specified diseases and conditions complicating pregnancy, childbirth and the puerperium: Secondary | ICD-10-CM | POA: Insufficient documentation

## 2012-02-10 DIAGNOSIS — O26859 Spotting complicating pregnancy, unspecified trimester: Secondary | ICD-10-CM | POA: Insufficient documentation

## 2012-02-10 HISTORY — DX: Encounter for supervision of normal pregnancy, unspecified, unspecified trimester: Z34.90

## 2012-02-10 NOTE — ED Notes (Signed)
No answer when called to treatment room.  

## 2012-02-10 NOTE — ED Notes (Signed)
Pt was called and looked for. Staff was told that pt had left.

## 2012-02-10 NOTE — ED Notes (Signed)
Pt reports being 3 months pregnant and has been spotting for 2 days, also thinks she may have a uti.  Due date July 17th.   3rd pregnancy.

## 2012-02-11 ENCOUNTER — Encounter (HOSPITAL_COMMUNITY): Payer: Self-pay | Admitting: Emergency Medicine

## 2012-02-11 ENCOUNTER — Emergency Department (HOSPITAL_COMMUNITY)
Admission: EM | Admit: 2012-02-11 | Discharge: 2012-02-11 | Disposition: A | Discharge status: Home / Self Care | Payer: Medicaid Other | Attending: Emergency Medicine | Admitting: Emergency Medicine

## 2012-02-11 ENCOUNTER — Emergency Department (HOSPITAL_COMMUNITY): Payer: Medicaid Other

## 2012-02-11 DIAGNOSIS — Z9089 Acquired absence of other organs: Secondary | ICD-10-CM | POA: Insufficient documentation

## 2012-02-11 DIAGNOSIS — Z8719 Personal history of other diseases of the digestive system: Secondary | ICD-10-CM | POA: Insufficient documentation

## 2012-02-11 DIAGNOSIS — O9989 Other specified diseases and conditions complicating pregnancy, childbirth and the puerperium: Secondary | ICD-10-CM | POA: Insufficient documentation

## 2012-02-11 DIAGNOSIS — Z79899 Other long term (current) drug therapy: Secondary | ICD-10-CM | POA: Insufficient documentation

## 2012-02-11 DIAGNOSIS — O200 Threatened abortion: Secondary | ICD-10-CM | POA: Insufficient documentation

## 2012-02-11 DIAGNOSIS — Z3490 Encounter for supervision of normal pregnancy, unspecified, unspecified trimester: Secondary | ICD-10-CM

## 2012-02-11 DIAGNOSIS — O468X9 Other antepartum hemorrhage, unspecified trimester: Secondary | ICD-10-CM | POA: Insufficient documentation

## 2012-02-11 DIAGNOSIS — O99330 Smoking (tobacco) complicating pregnancy, unspecified trimester: Secondary | ICD-10-CM | POA: Insufficient documentation

## 2012-02-11 DIAGNOSIS — R300 Dysuria: Secondary | ICD-10-CM | POA: Insufficient documentation

## 2012-02-11 DIAGNOSIS — R102 Pelvic and perineal pain: Secondary | ICD-10-CM

## 2012-02-11 DIAGNOSIS — O169 Unspecified maternal hypertension, unspecified trimester: Secondary | ICD-10-CM | POA: Insufficient documentation

## 2012-02-11 LAB — URINALYSIS, ROUTINE W REFLEX MICROSCOPIC
Bilirubin Urine: NEGATIVE
Hgb urine dipstick: NEGATIVE
Nitrite: NEGATIVE
Protein, ur: NEGATIVE mg/dL
Specific Gravity, Urine: >1.03 — ABNORMAL HIGH (ref 1.005–1.030)
Urobilinogen, UA: 0.2 mg/dL (ref 0.0–1.0)

## 2012-02-11 LAB — WET PREP, GENITAL: Yeast Wet Prep HPF POC: NONE SEEN

## 2012-02-11 NOTE — ED Notes (Signed)
Pt states she is [redacted] weeks pregnant adn began spotting with increased cramping and back pain since Thursday.

## 2012-02-11 NOTE — ED Provider Notes (Signed)
History   This chart was scribed for Flint Melter, MD by Charolett Bumpers, ED Scribe. The patient was seen in room APA08/APA08. Patient's care was started at 0949.   CSN: [id number]  Arrival date & time 02/11/12  0900   First MD Initiated Contact with Patient 02/11/12 [phone]      Chief Complaint  Patient presents with  . Threatened Miscarriage  . Abdominal Pain  . Vaginal Bleeding   The history is provided by the patient. No language interpreter was used.  Bonnie Jones is a 31 y.o. female who presents to the Emergency Department complaining of constant, severe lower abdominal pain, described as cramping, for the past 4 days. She reports associated vaginal bleeding that started 3 days ago, described as spotting and dysuria. She states that she ast saw Dr. Emelda Fear a week and a half ago.She denies any fever, cough, weakness, dizziness, vomiting or diarrhea. She denies any complications with the pregnancy so far. She is [redacted] weeks pregnant. She was here in ED for the same complaint yesterday and left AMA before being seen.   Past Medical History  Diagnosis Date  . Hypertension   . Weight decrease   . Asthma     as child  . GERD (gastroesophageal reflux disease)   . Pregnant     Past Surgical History  Procedure Date  . Tonsillectomy and adenoidectomy   . Mass excision 02/02/2011    Procedure: EXCISION MASS;  Surgeon: Jetty Duhamel, MD;  Location: [facility] OR;  Service: General;  Laterality: Left;  Excision of LEFT UPPER BACK MASS  . Back surgery     Family History  Problem Relation Age of Onset  . Hypertension Mother   . Heart disease Mother     History  Substance Use Topics  . Smoking status: Current Every Day Smoker -- 0.5 packs/day for 10 years    Types: Cigarettes  . Smokeless tobacco: Not on file  . Alcohol Use: 1.5 oz/week    3 drink(s) per week    OB History    Grav Para Term Preterm Abortions TAB SAB Ect Mult Living                  Review of  Systems  Constitutional: Negative for fever.  Respiratory: Negative for cough.   Gastrointestinal: Positive for abdominal pain. Negative for vomiting and diarrhea.  Genitourinary: Positive for dysuria and vaginal bleeding.  Neurological: Negative for dizziness and weakness.  All other systems reviewed and are negative.    Allergies  Review of patient's allergies indicates no known allergies.  Home Medications   Current Outpatient Rx  Name  Route  Sig  Dispense  Refill  . HYDROCHLOROTHIAZIDE 25 MG PO TABS   Oral   Take 25 mg by mouth daily.           Marland Kitchen PRENATAL MULTIVITAMIN CH   Oral   Take 1 tablet by mouth daily.           BP 116/50  Pulse 66  Temp 98.3 F (36.8 C) (Oral)  Resp 18  Ht 5\' 6"  (1.676 m)  Wt 176 lb (79.833 kg)  BMI 28.41 kg/m2  SpO2 99%  LMP 11/17/2011  Physical Exam  Nursing note and vitals reviewed. Constitutional: She is oriented to person, place, and time. She appears well-developed and well-nourished. No distress.  HENT:  Head: Normocephalic and atraumatic.  Eyes: Conjunctivae normal and EOM are normal.  Neck: Neck supple. No tracheal  deviation present.  Cardiovascular: Normal rate, regular rhythm and normal heart sounds.   No murmur heard. Pulmonary/Chest: Effort normal and breath sounds normal. No respiratory distress. She has no wheezes. She has no rhonchi. She has no rales.  Abdominal: Soft. She exhibits no distension. There is tenderness.       Diffuse abdominal tenderness. Uterus is not palpable.   Genitourinary: Vaginal discharge found.       White, frothy vaginal discharge. Cervix is long and closed. Uterus is gravid with tenderness. No adnexal tenderness or masses.   Musculoskeletal: Normal range of motion.  Neurological: She is alert and oriented to person, place, and time. No sensory deficit.  Skin: Skin is warm and dry.  Psychiatric: She has a normal mood and affect. Her behavior is normal.    ED Course  Procedures  (including critical care time)  DIAGNOSTIC STUDIES: Oxygen Saturation is 100% on room air, normal by my interpretation.    COORDINATION OF CARE:  09:55-Discussed planned course of treatment with the patient including a UA and consultation with Dr. Emelda Fear, who is agreeable at this time.   11:55-Preformed pelvic exam with chaperon present. Will order an ultrasound and Dr. Emelda Fear to see in ED.    Evaluation at Discharge: Patient is comfortable, and has no further complaints.   discussed findings with Dr. Emelda Fear, he will see. The patient in the office later this week.  Results for orders placed during the hospital encounter of 02/11/12  URINALYSIS, ROUTINE W REFLEX MICROSCOPIC      Component Value Range   Color, Urine YELLOW  YELLOW   APPearance CLEAR  CLEAR   Specific Gravity, Urine >1.030 (*) 1.005 - 1.030   pH 6.0  5.0 - 8.0   Glucose, UA NEGATIVE  NEGATIVE mg/dL   Hgb urine dipstick NEGATIVE  NEGATIVE   Bilirubin Urine NEGATIVE  NEGATIVE   Ketones, ur NEGATIVE  NEGATIVE mg/dL   Protein, ur NEGATIVE  NEGATIVE mg/dL   Urobilinogen, UA 0.2  0.0 - 1.0 mg/dL   Nitrite NEGATIVE  NEGATIVE   Leukocytes, UA NEGATIVE  NEGATIVE  WET PREP, GENITAL      Component Value Range   Yeast Wet Prep HPF POC NONE SEEN  NONE SEEN   Trich, Wet Prep NONE SEEN  NONE SEEN   Clue Cells Wet Prep HPF POC FEW (*) NONE SEEN   WBC, Wet Prep HPF POC FEW (*) NONE SEEN     Nursing notes, applicable records and vitals reviewed.  Radiologic Images/Reports reviewed.   1. Pelvic pain   2. Pregnancy       MDM  Episode of vaginal bleeding in first trimester pregnancy. No blood in the vagina on exam today. No clear evidence for vaginal infection. Doubt preterm labor.Cause No apparent evidence of threatened abortion.  I personally performed the services described in this documentation, which was scribed in my presence. The recorded information has been reviewed and is accurate.   Plan: Home  Medications- usual; Home Treatments- rest, no intercourse; Recommended follow up- OB in 3-4 days       Flint Melter, MD 02/12/12 [phone]

## 2012-02-12 LAB — GC/CHLAMYDIA PROBE AMP
CT Probe RNA: NEGATIVE
GC Probe RNA: NEGATIVE

## 2012-02-13 LAB — URINE CULTURE

## 2012-02-13 NOTE — L&D Delivery Note (Signed)
I have seen and examined this patient and I agree with the above. Present for delivery. Cord around neck x 1- reduced after delivery. EBL 200cc. Cam Hai 3:52 AM 08/23/2012

## 2012-02-13 NOTE — L&D Delivery Note (Signed)
Delivery Note At 3:34 AM a viable female was delivered via Vaginal, Spontaneous Delivery (Presentation:vertex ; LOA ).  APGAR:8 ,9 ; weight pending .   Placenta status: spontaneous ,intact .  Cord: 3 vessel with the following complications:none .   Anesthesia: Epidural  Episiotomy: none Lacerations: none Est. Blood Loss (mL):  Mom to postpartum.  Baby to nursery-stable.  Bonnie Jones, Carren Blakley 08/23/2012, 3:46 AM

## 2012-02-14 ENCOUNTER — Other Ambulatory Visit (HOSPITAL_COMMUNITY)
Admission: RE | Admit: 2012-02-14 | Discharge: 2012-02-14 | Disposition: A | Discharge status: Home / Self Care | Payer: Medicaid Other | Source: Ambulatory Visit | Attending: Obstetrics and Gynecology | Admitting: Obstetrics and Gynecology

## 2012-02-14 ENCOUNTER — Other Ambulatory Visit: Payer: Self-pay | Admitting: Adult Health

## 2012-02-14 DIAGNOSIS — Z113 Encounter for screening for infections with a predominantly sexual mode of transmission: Secondary | ICD-10-CM | POA: Insufficient documentation

## 2012-02-14 DIAGNOSIS — Z01419 Encounter for gynecological examination (general) (routine) without abnormal findings: Secondary | ICD-10-CM | POA: Insufficient documentation

## 2012-02-14 DIAGNOSIS — Z1151 Encounter for screening for human papillomavirus (HPV): Secondary | ICD-10-CM | POA: Insufficient documentation

## 2012-02-14 NOTE — ED Notes (Signed)
+   Urine Chart sent to EDP office for review. 

## 2012-02-16 ENCOUNTER — Telehealth (HOSPITAL_COMMUNITY): Payer: Self-pay | Admitting: Emergency Medicine

## 2012-02-16 NOTE — ED Notes (Signed)
Chart returned from EDP office. Prescribed Amoxicillin 500 mg 1 po tid x 7 days #21. Prescribed by Carleene Cooper MD.

## 2012-03-23 ENCOUNTER — Telehealth (HOSPITAL_COMMUNITY): Payer: Self-pay | Admitting: Emergency Medicine

## 2012-03-23 NOTE — ED Notes (Signed)
No response to letter sent after 30 days. Chart sent to Medical Records. °

## 2012-04-24 ENCOUNTER — Encounter: Payer: Self-pay | Admitting: *Deleted

## 2012-05-12 ENCOUNTER — Other Ambulatory Visit: Payer: Medicaid Other

## 2012-05-12 DIAGNOSIS — O10019 Pre-existing essential hypertension complicating pregnancy, unspecified trimester: Secondary | ICD-10-CM

## 2012-05-13 ENCOUNTER — Ambulatory Visit (INDEPENDENT_AMBULATORY_CARE_PROVIDER_SITE_OTHER): Payer: Medicaid Other | Admitting: Advanced Practice Midwife

## 2012-05-13 ENCOUNTER — Encounter: Payer: Self-pay | Admitting: Advanced Practice Midwife

## 2012-05-13 VITALS — BP 132/52 | Wt 196.0 lb

## 2012-05-13 DIAGNOSIS — Z331 Pregnant state, incidental: Secondary | ICD-10-CM

## 2012-05-13 DIAGNOSIS — O10019 Pre-existing essential hypertension complicating pregnancy, unspecified trimester: Secondary | ICD-10-CM

## 2012-05-13 DIAGNOSIS — Z3482 Encounter for supervision of other normal pregnancy, second trimester: Secondary | ICD-10-CM

## 2012-05-13 DIAGNOSIS — O09622 Supervision of young multigravida, second trimester: Secondary | ICD-10-CM

## 2012-05-13 DIAGNOSIS — O09299 Supervision of pregnancy with other poor reproductive or obstetric history, unspecified trimester: Secondary | ICD-10-CM

## 2012-05-13 DIAGNOSIS — O09629 Supervision of young multigravida, unspecified trimester: Secondary | ICD-10-CM | POA: Insufficient documentation

## 2012-05-13 DIAGNOSIS — O10912 Unspecified pre-existing hypertension complicating pregnancy, second trimester: Secondary | ICD-10-CM

## 2012-05-13 DIAGNOSIS — Z1389 Encounter for screening for other disorder: Secondary | ICD-10-CM

## 2012-05-13 LAB — POCT URINALYSIS DIPSTICK
Ketones, UA: NEGATIVE
Leukocytes, UA: NEGATIVE
Protein, UA: NEGATIVE

## 2012-05-13 NOTE — Progress Notes (Signed)
Pain in lower right side.

## 2012-05-13 NOTE — Patient Instructions (Signed)
Nothing to eat or drink after midnight the night before your next visit.  You will be here for at least 2 hours.

## 2012-05-13 NOTE — Assessment & Plan Note (Addendum)
Quit meds at beginning of pregnancy Baseline 24hr protein 3/31: 74  Jones, Bonnie Every 2:34 PM

## 2012-05-13 NOTE — Progress Notes (Signed)
Denies uc's, lof, vb. Reports good fm. RLP- relief measures discussed. Reflux- tums doesn't work- discussed otc meds. All questions answered.  Dropped off 24hr urine for baseline protein yest.

## 2012-06-03 ENCOUNTER — Ambulatory Visit (INDEPENDENT_AMBULATORY_CARE_PROVIDER_SITE_OTHER): Payer: Medicaid Other

## 2012-06-03 ENCOUNTER — Other Ambulatory Visit: Payer: Self-pay | Admitting: Women's Health

## 2012-06-03 ENCOUNTER — Other Ambulatory Visit: Payer: Medicaid Other

## 2012-06-03 ENCOUNTER — Ambulatory Visit (INDEPENDENT_AMBULATORY_CARE_PROVIDER_SITE_OTHER): Payer: Medicaid Other | Admitting: Women's Health

## 2012-06-03 ENCOUNTER — Encounter: Payer: Self-pay | Admitting: Women's Health

## 2012-06-03 VITALS — BP 138/70 | Wt 195.0 lb

## 2012-06-03 DIAGNOSIS — O289 Unspecified abnormal findings on antenatal screening of mother: Secondary | ICD-10-CM

## 2012-06-03 DIAGNOSIS — Z3482 Encounter for supervision of other normal pregnancy, second trimester: Secondary | ICD-10-CM

## 2012-06-03 DIAGNOSIS — O09622 Supervision of young multigravida, second trimester: Secondary | ICD-10-CM

## 2012-06-03 DIAGNOSIS — Z1389 Encounter for screening for other disorder: Secondary | ICD-10-CM

## 2012-06-03 DIAGNOSIS — O09629 Supervision of young multigravida, unspecified trimester: Secondary | ICD-10-CM

## 2012-06-03 DIAGNOSIS — Z331 Pregnant state, incidental: Secondary | ICD-10-CM

## 2012-06-03 DIAGNOSIS — O358XX0 Maternal care for other (suspected) fetal abnormality and damage, not applicable or unspecified: Secondary | ICD-10-CM

## 2012-06-03 DIAGNOSIS — O99019 Anemia complicating pregnancy, unspecified trimester: Secondary | ICD-10-CM

## 2012-06-03 DIAGNOSIS — I10 Essential (primary) hypertension: Secondary | ICD-10-CM | POA: Insufficient documentation

## 2012-06-03 LAB — POCT URINALYSIS DIPSTICK
Nitrite, UA: NEGATIVE
Protein, UA: NEGATIVE

## 2012-06-03 NOTE — Progress Notes (Signed)
Reports good fm. Denies uc's, lof, vb, urinary frequency, urgency, hesitancy, or dysuria.  Reports pelvic pressure- worse when walking. Recommended pregnancy belt.  Also reports heartburn- has been taking zantac, prilosec prn- recommended choosing one and take daily- reviewed foods to avoid. Reviewed ptl s/s and fetal kick counts.   Discussed u/s results. All questions answered. PN2 today. Discussed h/o CHTN w/ LHE- pt had lost 50lbs prior to preg and meds were d/c'd- bp's have been good so far during pregnancy- no antenatal testing needed.

## 2012-06-03 NOTE — Progress Notes (Signed)
Having a lot of pressure.

## 2012-06-03 NOTE — Patient Instructions (Addendum)
Heartburn During Pregnancy  Heartburn is a burning sensation in the chest caused by stomach acid backing up into the esophagus. Heartburn (also known as "reflux") is common in pregnancy because a certain hormone (progesterone) changes. The progesterone hormone may relax the valve that separates the esophagus from the stomach. This allows acid to go up into the esophagus, causing heartburn. Heartburn may also happen in pregnancy because the enlarging uterus pushes up on the stomach, which pushes more acid into the esophagus. This is especially true in the later stages of pregnancy. Heartburn problems usually go away after giving birth. CAUSES   The progesterone hormone.  Changing hormone levels.  The growing uterus that pushes stomach acid upward.  Large meals.  Certain foods and drinks.  Exercise.  Increased acid production. SYMPTOMS   Burning pain in the chest or lower throat.  Bitter taste in the mouth.  Coughing. DIAGNOSIS  Heartburn is typically diagnosed by your caregiver when taking a careful history of your concern. Your caregiver may order a blood test to check for a certain type of bacteria that is associated with heartburn. Sometimes, heartburn is diagnosed by prescribing a heartburn medicine to see if the symptoms improve. It is rare in pregnancy to have a procedure called an endoscopy. This is when a tube with a light and a camera on the end is used to examine the esophagus and the stomach. TREATMENT   Your caregiver may tell you to use certain over-the-counter medicines (antacids, acid reducers) for mild heartburn.  Your caregiver may prescribe medicines to decrease stomach acid or to protect your stomach lining.  Your caregiver may recommend certain diet changes.  For severe cases, your caregiver may recommend that the head of the bed be elevated on blocks. (Sleeping with more pillows is not an effective treatment as it only changes the position of your head and does  not improve the main problem of stomach acid refluxing into the esophagus.) HOME CARE INSTRUCTIONS   Take all medicines as directed by your caregiver.  Raise the head of your bed by putting blocks under the legs if instructed to by your caregiver.  Do not exercise right after eating.  Avoid eating 2 or 3 hours before bed. Do not lie down right after eating.  Eat small meals throughout the day instead of 3 large meals.  Identify foods and beverages that make your symptoms worse and avoid them. Foods you may want to avoid include:  Peppers.  Chocolate.  High-fat foods, including fried foods.  Spicy foods.  Garlic and onions.  Citrus fruits, including oranges, grapefruit, lemons, and limes.  Food containing tomatoes or tomato products.  Mint.  Carbonated and caffeinated drinks.  Vinegar. SEEK IMMEDIATE MEDICAL CARE IF:   You have severe chest pain that goes down your arm or into your jaw or neck.  You feel sweaty, dizzy, or lightheaded.  You become short of breath.  You vomit blood.  You have difficulty or pain with swallowing.  You have bloody or black, tarry stools.  You have episodes of heartburn more than 3 times a week, for more than 2 weeks. MAKE SURE YOU:  Understand these instructions.  Will watch your condition.  Will get help right away if you are not doing well or get worse. Document Released: 01/27/2000 Document Revised: 04/23/2011 Document Reviewed: 07/20/2010 [employer] Patient Information 2013 [employer].  

## 2012-06-03 NOTE — Progress Notes (Signed)
U/S(27+5wks)-active vtx fetus, approp growth EFW 2 lb 10 oz (1179 gms. 58th%tile), fluid wnl, post gr 1 plac, cx long and closed, female fetus, bowel echogenicity appears wnl today

## 2012-06-04 LAB — GLUCOSE TOLERANCE, 2 HOURS W/ 1HR
Glucose, 2 hour: 94 mg/dL (ref 70–139)
Glucose, Fasting: 67 mg/dL — ABNORMAL LOW (ref 70–99)

## 2012-06-04 LAB — CBC
HCT: 32.2 % — ABNORMAL LOW (ref 36.0–46.0)
Hemoglobin: 10.8 g/dL — ABNORMAL LOW (ref 12.0–15.0)
MCHC: 33.5 g/dL (ref 30.0–36.0)
MCV: 91.7 fL (ref 78.0–100.0)
RDW: 13.7 % (ref 11.5–15.5)

## 2012-06-04 LAB — RPR

## 2012-06-04 LAB — ANTIBODY SCREEN: Antibody Screen: NEGATIVE

## 2012-06-04 LAB — HSV 2 ANTIBODY, IGG: HSV 2 Glycoprotein G Ab, IgG: 7.26 IV — ABNORMAL HIGH

## 2012-06-07 ENCOUNTER — Encounter: Payer: Self-pay | Admitting: Women's Health

## 2012-06-07 DIAGNOSIS — B009 Herpesviral infection, unspecified: Secondary | ICD-10-CM | POA: Insufficient documentation

## 2012-06-08 LAB — US OB FOLLOW UP

## 2012-07-02 ENCOUNTER — Encounter: Payer: Self-pay | Admitting: Obstetrics and Gynecology

## 2012-07-02 ENCOUNTER — Ambulatory Visit (INDEPENDENT_AMBULATORY_CARE_PROVIDER_SITE_OTHER): Payer: Medicaid Other | Admitting: Obstetrics and Gynecology

## 2012-07-02 VITALS — BP 110/76 | Wt 198.8 lb

## 2012-07-02 DIAGNOSIS — O98519 Other viral diseases complicating pregnancy, unspecified trimester: Secondary | ICD-10-CM

## 2012-07-02 DIAGNOSIS — Z1389 Encounter for screening for other disorder: Secondary | ICD-10-CM

## 2012-07-02 DIAGNOSIS — O99019 Anemia complicating pregnancy, unspecified trimester: Secondary | ICD-10-CM

## 2012-07-02 LAB — POCT URINALYSIS DIPSTICK
Glucose, UA: NEGATIVE
Leukocytes, UA: NEGATIVE
Nitrite, UA: NEGATIVE

## 2012-07-02 MED ORDER — ACYCLOVIR 400 MG PO TABS: 400.0000 mg | ORAL_TABLET | Freq: Two times a day (BID) | ORAL | Status: DC

## 2012-07-02 NOTE — Progress Notes (Signed)
Pt here today for routine prenatal visit. Pt states she thinks she may have a sinus infection. Having some back pain as well. No other problems voiced. Good baby movement. Felt sick x 1 wk. No fever,  1. HSV-II serology POS discussed. Rx to be sent to pharm. 2.Hx HTN BP excellent to date.

## 2012-07-02 NOTE — Patient Instructions (Signed)
Herpes During and After Pregnancy Genital herpes is a sexually transmitted infection (STI). It is caused by a virus and can be very serious during pregnancy. The greatest concern is passing the virus to the fetus and newborn. The virus is more likely to be passed to the newborn during delivery if the mother becomes infected for the first time late in her pregnancy (primary infection). It is less common for the virus to pass to the newborn if the mother had herpes before becoming pregnant. This is because antibodies against the virus develop over a period of time. These antibodies help protect the baby. Lastly, the infection can pass to the fetus through the placenta. This can happen if the mother gets herpes for the first time in the first 3 months of her pregnancy (first trimester). This can possibly cause a miscarriage or birth defects in the baby. TREATMENT DURING PREGNANCY Medicines may be prescribed that are safe for the mother and the fetus. The medicine can lessen symptoms or prevent a recurrence of the infection. If the infection happened before becoming pregnant, medicine may be prescribed in the last 4 weeks of the pregnancy. This can prevent a recurrent infection at the time of delivery.  RECOMMENDED DELIVERY METHOD If an active, recurrent infection is present at the time delivery, the baby should be delivered by cesarean delivery. This is because the virus can pass to the baby through an infected birth canal. This can cause severe problems for the baby. If the infection happens for the first time late in the pregnancy, the caregiver may also recommend a cesarean delivery. With a new infection, the body has not had the time to build up enough antibodies against the virus to protect the baby from getting the infection. Even if the birth canal does not have visible sores (lesions) from the herpes virus, there is still that chance that the virus can spread to the baby. A cesarean delivery should be  done if there is any signs or feeling of an infection being present in the genital area. Cesarean delivery is not recommended for women with a history of herpes infection but no evidence of active genital lesions at the time of delivery. Lesions that have crusted fully are considered healed and not active. BREASTFEEDING  Women infected with genital herpes can breastfeed their baby. The virus will not be present in the breast milk. If lesions are present on the breast, the baby should not breastfeed from the affected breast(s). HOW TO PREVENT PASSING HERPES TO YOUR BABY AFTER DELIVERY  Wash your hands with soap and water often and before touching your baby.  If you have an outbreak, keep the area clean and covered.  Try to avoid physical and stressful situations that may bring on an outbreak. SEEK IMMEDIATE MEDICAL CARE IF:   You have an outbreak during pregnancy and cannot urinate.  You have an outbreak anytime during your pregnancy and especially in the last 3 months of the pregnancy.  You think you are having an allergic reaction or side effects from the medicine you are taking. Document Released: 05/07/2000 Document Revised: 04/23/2011 Document Reviewed: 01/09/2011 [employer] Patient Information 2014 [employer].  

## 2012-07-17 ENCOUNTER — Encounter: Payer: Medicaid Other | Admitting: Advanced Practice Midwife

## 2012-07-18 ENCOUNTER — Ambulatory Visit (INDEPENDENT_AMBULATORY_CARE_PROVIDER_SITE_OTHER): Payer: Medicaid Other | Admitting: Obstetrics and Gynecology

## 2012-07-18 VITALS — Wt 200.6 lb

## 2012-07-18 DIAGNOSIS — Z331 Pregnant state, incidental: Secondary | ICD-10-CM

## 2012-07-18 DIAGNOSIS — Z1389 Encounter for screening for other disorder: Secondary | ICD-10-CM

## 2012-07-18 DIAGNOSIS — O99019 Anemia complicating pregnancy, unspecified trimester: Secondary | ICD-10-CM

## 2012-07-18 DIAGNOSIS — O368190 Decreased fetal movements, unspecified trimester, not applicable or unspecified: Secondary | ICD-10-CM

## 2012-07-18 DIAGNOSIS — O98519 Other viral diseases complicating pregnancy, unspecified trimester: Secondary | ICD-10-CM

## 2012-07-18 DIAGNOSIS — O9989 Other specified diseases and conditions complicating pregnancy, childbirth and the puerperium: Secondary | ICD-10-CM

## 2012-07-18 DIAGNOSIS — O368191 Decreased fetal movements, unspecified trimester, fetus 1: Secondary | ICD-10-CM

## 2012-07-18 LAB — POCT URINALYSIS DIPSTICK
Glucose, UA: NEGATIVE
Nitrite, UA: NEGATIVE

## 2012-07-18 NOTE — Progress Notes (Signed)
C/o lower abdominal and vaginal pain, decrease FM. NST done today REACTive.

## 2012-07-18 NOTE — Patient Instructions (Signed)
Fetal Movement Counts Patient Name: __________________________________________________ Patient Due Date: ____________________ Performing a fetal movement count is highly recommended in high-risk pregnancies, but it is good for every pregnant woman to do. Your caregiver may ask you to start counting fetal movements at 28 weeks of the pregnancy. Fetal movements often increase:  After eating a full meal.  After physical activity.  After eating or drinking something sweet or cold.  At rest. Pay attention to when you feel the baby is most active. This will help you notice a pattern of your baby's sleep and wake cycles and what factors contribute to an increase in fetal movement. It is important to perform a fetal movement count at the same time each day when your baby is normally most active.  HOW TO COUNT FETAL MOVEMENTS 1. Find a quiet and comfortable area to sit or lie down on your left side. Lying on your left side provides the best blood and oxygen circulation to your baby. 2. Write down the day and time on a sheet of paper or in a journal. 3. Start counting kicks, flutters, swishes, rolls, or jabs in a 2 hour period. You should feel at least 10 movements within 2 hours. 4. If you do not feel 10 movements in 2 hours, wait 2 3 hours and count again. Look for a change in the pattern or not enough counts in 2 hours. SEEK MEDICAL CARE IF:  You feel less than 10 counts in 2 hours, tried twice.  There is no movement in over an hour.  The pattern is changing or taking longer each day to reach 10 counts in 2 hours.  You feel the baby is not moving as he or she usually does. Date: ____________ Movements: ____________ Start time: ____________ Finish time: ____________  Date: ____________ Movements: ____________ Start time: ____________ Finish time: ____________ Date: ____________ Movements: ____________ Start time: ____________ Finish time: ____________ Date: ____________ Movements: ____________  Start time: ____________ Finish time: ____________ Date: ____________ Movements: ____________ Start time: ____________ Finish time: ____________ Date: ____________ Movements: ____________ Start time: ____________ Finish time: ____________ Date: ____________ Movements: ____________ Start time: ____________ Finish time: ____________ Date: ____________ Movements: ____________ Start time: ____________ Finish time: ____________  Date: ____________ Movements: ____________ Start time: ____________ Finish time: ____________ Date: ____________ Movements: ____________ Start time: ____________ Finish time: ____________ Date: ____________ Movements: ____________ Start time: ____________ Finish time: ____________ Date: ____________ Movements: ____________ Start time: ____________ Finish time: ____________ Date: ____________ Movements: ____________ Start time: ____________ Finish time: ____________ Date: ____________ Movements: ____________ Start time: ____________ Finish time: ____________ Date: ____________ Movements: ____________ Start time: ____________ Finish time: ____________  Date: ____________ Movements: ____________ Start time: ____________ Finish time: ____________ Date: ____________ Movements: ____________ Start time: ____________ Finish time: ____________ Date: ____________ Movements: ____________ Start time: ____________ Finish time: ____________ Date: ____________ Movements: ____________ Start time: ____________ Finish time: ____________ Date: ____________ Movements: ____________ Start time: ____________ Finish time: ____________ Date: ____________ Movements: ____________ Start time: ____________ Finish time: ____________ Date: ____________ Movements: ____________ Start time: ____________ Finish time: ____________  Date: ____________ Movements: ____________ Start time: ____________ Finish time: ____________ Date: ____________ Movements: ____________ Start time: ____________ Finish time:  ____________ Date: ____________ Movements: ____________ Start time: ____________ Finish time: ____________ Date: ____________ Movements: ____________ Start time: ____________ Finish time: ____________ Date: ____________ Movements: ____________ Start time: ____________ Finish time: ____________ Date: ____________ Movements: ____________ Start time: ____________ Finish time: ____________ Date: ____________ Movements: ____________ Start time: ____________ Finish time: ____________  Date: ____________ Movements: ____________ Start time: ____________ Finish   time: ____________ Date: ____________ Movements: ____________ Start time: ____________ Finish time: ____________ Date: ____________ Movements: ____________ Start time: ____________ Finish time: ____________ Date: ____________ Movements: ____________ Start time: ____________ Finish time: ____________ Date: ____________ Movements: ____________ Start time: ____________ Finish time: ____________ Date: ____________ Movements: ____________ Start time: ____________ Finish time: ____________ Date: ____________ Movements: ____________ Start time: ____________ Finish time: ____________  Date: ____________ Movements: ____________ Start time: ____________ Finish time: ____________ Date: ____________ Movements: ____________ Start time: ____________ Finish time: ____________ Date: ____________ Movements: ____________ Start time: ____________ Finish time: ____________ Date: ____________ Movements: ____________ Start time: ____________ Finish time: ____________ Date: ____________ Movements: ____________ Start time: ____________ Finish time: ____________ Date: ____________ Movements: ____________ Start time: ____________ Finish time: ____________ Date: ____________ Movements: ____________ Start time: ____________ Finish time: ____________  Date: ____________ Movements: ____________ Start time: ____________ Finish time: ____________ Date: ____________ Movements:  ____________ Start time: ____________ Finish time: ____________ Date: ____________ Movements: ____________ Start time: ____________ Finish time: ____________ Date: ____________ Movements: ____________ Start time: ____________ Finish time: ____________ Date: ____________ Movements: ____________ Start time: ____________ Finish time: ____________ Date: ____________ Movements: ____________ Start time: ____________ Finish time: ____________ Date: ____________ Movements: ____________ Start time: ____________ Finish time: ____________  Date: ____________ Movements: ____________ Start time: ____________ Finish time: ____________ Date: ____________ Movements: ____________ Start time: ____________ Finish time: ____________ Date: ____________ Movements: ____________ Start time: ____________ Finish time: ____________ Date: ____________ Movements: ____________ Start time: ____________ Finish time: ____________ Date: ____________ Movements: ____________ Start time: ____________ Finish time: ____________ Date: ____________ Movements: ____________ Start time: ____________ Finish time: ____________ Document Released: 02/28/2006 Document Revised: 01/16/2012 Document Reviewed: 11/26/2011 [employer] Patient Information 2014 [employer].  

## 2012-07-31 ENCOUNTER — Encounter: Payer: Self-pay | Admitting: Advanced Practice Midwife

## 2012-07-31 ENCOUNTER — Ambulatory Visit (INDEPENDENT_AMBULATORY_CARE_PROVIDER_SITE_OTHER): Payer: Medicaid Other | Admitting: Obstetrics & Gynecology

## 2012-07-31 VITALS — BP 130/76 | Wt 204.2 lb

## 2012-07-31 DIAGNOSIS — O99019 Anemia complicating pregnancy, unspecified trimester: Secondary | ICD-10-CM

## 2012-07-31 DIAGNOSIS — O98519 Other viral diseases complicating pregnancy, unspecified trimester: Secondary | ICD-10-CM

## 2012-07-31 DIAGNOSIS — O09299 Supervision of pregnancy with other poor reproductive or obstetric history, unspecified trimester: Secondary | ICD-10-CM

## 2012-07-31 DIAGNOSIS — Z1389 Encounter for screening for other disorder: Secondary | ICD-10-CM

## 2012-07-31 DIAGNOSIS — Z331 Pregnant state, incidental: Secondary | ICD-10-CM

## 2012-07-31 LAB — POCT URINALYSIS DIPSTICK
Ketones, UA: NEGATIVE
Leukocytes, UA: NEGATIVE

## 2012-07-31 NOTE — Progress Notes (Signed)
Taking gaviscon for heartburn. Now has reflux Nexium  Routine questions about pregnancy answered.  F/U in 1 weeks for LROB.

## 2012-08-05 ENCOUNTER — Telehealth: Payer: Self-pay | Admitting: Advanced Practice Midwife

## 2012-08-05 MED ORDER — ESOMEPRAZOLE MAGNESIUM 40 MG PO PACK: 40.0000 mg | PACK | Freq: Every day | ORAL | Status: DC

## 2012-08-05 NOTE — Telephone Encounter (Signed)
Pt requesting Nexium for Acid Reflux.

## 2012-08-07 ENCOUNTER — Ambulatory Visit (INDEPENDENT_AMBULATORY_CARE_PROVIDER_SITE_OTHER): Payer: Medicaid Other | Admitting: Advanced Practice Midwife

## 2012-08-07 ENCOUNTER — Encounter: Payer: Self-pay | Admitting: Advanced Practice Midwife

## 2012-08-07 ENCOUNTER — Other Ambulatory Visit: Payer: Self-pay | Admitting: *Deleted

## 2012-08-07 VITALS — BP 142/60 | Wt 206.2 lb

## 2012-08-07 DIAGNOSIS — Z131 Encounter for screening for diabetes mellitus: Secondary | ICD-10-CM

## 2012-08-07 DIAGNOSIS — O09299 Supervision of pregnancy with other poor reproductive or obstetric history, unspecified trimester: Secondary | ICD-10-CM

## 2012-08-07 DIAGNOSIS — Z331 Pregnant state, incidental: Secondary | ICD-10-CM

## 2012-08-07 DIAGNOSIS — Z3483 Encounter for supervision of other normal pregnancy, third trimester: Secondary | ICD-10-CM

## 2012-08-07 DIAGNOSIS — O98519 Other viral diseases complicating pregnancy, unspecified trimester: Secondary | ICD-10-CM

## 2012-08-07 DIAGNOSIS — O99019 Anemia complicating pregnancy, unspecified trimester: Secondary | ICD-10-CM

## 2012-08-07 DIAGNOSIS — R81 Glycosuria: Secondary | ICD-10-CM

## 2012-08-07 DIAGNOSIS — Z1389 Encounter for screening for other disorder: Secondary | ICD-10-CM

## 2012-08-07 LAB — POCT URINALYSIS DIPSTICK
Blood, UA: NEGATIVE
Nitrite, UA: NEGATIVE

## 2012-08-07 NOTE — Telephone Encounter (Signed)
RX for Nexium changed to tablet and not packet due to insurance not covering original prescription.

## 2012-08-07 NOTE — Progress Notes (Signed)
No c/o at this time.  Denies HA, vision changes, RUQ pain. GBS collected.  Routine questions about pregnancy answered.  F/U in monday weeks for B/P check.

## 2012-08-08 LAB — GC/CHLAMYDIA PROBE AMP
CT Probe RNA: NEGATIVE
GC Probe RNA: NEGATIVE

## 2012-08-11 ENCOUNTER — Ambulatory Visit (INDEPENDENT_AMBULATORY_CARE_PROVIDER_SITE_OTHER): Payer: Medicaid Other | Admitting: Women's Health

## 2012-08-11 ENCOUNTER — Encounter: Payer: Self-pay | Admitting: Women's Health

## 2012-08-11 VITALS — BP 136/80 | Wt 207.0 lb

## 2012-08-11 DIAGNOSIS — O99019 Anemia complicating pregnancy, unspecified trimester: Secondary | ICD-10-CM

## 2012-08-11 DIAGNOSIS — B009 Herpesviral infection, unspecified: Secondary | ICD-10-CM

## 2012-08-11 DIAGNOSIS — Z1389 Encounter for screening for other disorder: Secondary | ICD-10-CM

## 2012-08-11 DIAGNOSIS — O98519 Other viral diseases complicating pregnancy, unspecified trimester: Secondary | ICD-10-CM

## 2012-08-11 DIAGNOSIS — Z331 Pregnant state, incidental: Secondary | ICD-10-CM

## 2012-08-11 DIAGNOSIS — O09299 Supervision of pregnancy with other poor reproductive or obstetric history, unspecified trimester: Secondary | ICD-10-CM

## 2012-08-11 DIAGNOSIS — O09623 Supervision of young multigravida, third trimester: Secondary | ICD-10-CM

## 2012-08-11 LAB — POCT URINALYSIS DIPSTICK
Glucose, UA: NEGATIVE
Nitrite, UA: NEGATIVE

## 2012-08-11 NOTE — Progress Notes (Signed)
Reports good fm. Denies regular uc's, lof, vb, urinary frequency, urgency, hesitancy, or dysuria.  Denies scotomata, ruq/epigastric pain, n/v. Had ha today, hasn't taken anything for it. BP normal, DTRs 1+, no clonus, 1+ BLE edema. Wants to be induced. Discussed no indication for IOL at this time.  Reviewed labor s/s, fetal kick counts, and pre-e warning s/s.  All questions answered. F/U in 1wk for visit.

## 2012-08-11 NOTE — Patient Instructions (Signed)

## 2012-08-11 NOTE — Progress Notes (Signed)
Lots of pain and pressure.

## 2012-08-19 ENCOUNTER — Ambulatory Visit (INDEPENDENT_AMBULATORY_CARE_PROVIDER_SITE_OTHER): Payer: Medicaid Other | Admitting: Advanced Practice Midwife

## 2012-08-19 VITALS — BP 144/78 | Wt 209.0 lb

## 2012-08-19 DIAGNOSIS — O163 Unspecified maternal hypertension, third trimester: Secondary | ICD-10-CM

## 2012-08-19 DIAGNOSIS — O98519 Other viral diseases complicating pregnancy, unspecified trimester: Secondary | ICD-10-CM

## 2012-08-19 DIAGNOSIS — Z1389 Encounter for screening for other disorder: Secondary | ICD-10-CM

## 2012-08-19 DIAGNOSIS — O99019 Anemia complicating pregnancy, unspecified trimester: Secondary | ICD-10-CM

## 2012-08-19 DIAGNOSIS — O09299 Supervision of pregnancy with other poor reproductive or obstetric history, unspecified trimester: Secondary | ICD-10-CM

## 2012-08-19 DIAGNOSIS — Z331 Pregnant state, incidental: Secondary | ICD-10-CM

## 2012-08-19 LAB — CBC
MCH: 30.8 pg (ref 26.0–34.0)
MCV: 87.5 fL (ref 78.0–100.0)
Platelets: 254 10*3/uL (ref 150–400)
RDW: 13.8 % (ref 11.5–15.5)

## 2012-08-19 LAB — POCT URINALYSIS DIPSTICK: Protein, UA: NEGATIVE

## 2012-08-19 NOTE — Progress Notes (Signed)
C/o lower abdominal pressure and pain. Denies HA, vision changes/RUQ pain. DTR's 2+. PreX labs done today.  Will f/u tomorrow for b/p check.

## 2012-08-20 ENCOUNTER — Telehealth (HOSPITAL_COMMUNITY): Payer: Self-pay | Admitting: *Deleted

## 2012-08-20 ENCOUNTER — Ambulatory Visit (INDEPENDENT_AMBULATORY_CARE_PROVIDER_SITE_OTHER): Payer: Medicaid Other | Admitting: Obstetrics and Gynecology

## 2012-08-20 VITALS — BP 122/78 | Wt 208.0 lb

## 2012-08-20 DIAGNOSIS — O09299 Supervision of pregnancy with other poor reproductive or obstetric history, unspecified trimester: Secondary | ICD-10-CM

## 2012-08-20 DIAGNOSIS — Z1389 Encounter for screening for other disorder: Secondary | ICD-10-CM

## 2012-08-20 DIAGNOSIS — O99019 Anemia complicating pregnancy, unspecified trimester: Secondary | ICD-10-CM

## 2012-08-20 DIAGNOSIS — O139 Gestational [pregnancy-induced] hypertension without significant proteinuria, unspecified trimester: Secondary | ICD-10-CM

## 2012-08-20 DIAGNOSIS — O98519 Other viral diseases complicating pregnancy, unspecified trimester: Secondary | ICD-10-CM

## 2012-08-20 DIAGNOSIS — Z331 Pregnant state, incidental: Secondary | ICD-10-CM

## 2012-08-20 DIAGNOSIS — O0993 Supervision of high risk pregnancy, unspecified, third trimester: Secondary | ICD-10-CM

## 2012-08-20 LAB — POCT URINALYSIS DIPSTICK: Ketones, UA: NEGATIVE

## 2012-08-20 LAB — COMPREHENSIVE METABOLIC PANEL
ALT: 38 U/L — ABNORMAL HIGH (ref 0–35)
AST: 31 U/L (ref 0–37)
CO2: 21 mEq/L (ref 19–32)
Creat: 0.56 mg/dL (ref 0.50–1.10)
Total Bilirubin: 0.4 mg/dL (ref 0.3–1.2)

## 2012-08-20 LAB — PROTEIN / CREATININE RATIO, URINE: Creatinine, Urine: 128.2 mg/dL

## 2012-08-20 NOTE — Addendum Note (Signed)
Addended by: Tilda Burrow on: 08/20/2012 10:51 AM   Modules accepted: Orders

## 2012-08-20 NOTE — Progress Notes (Signed)
C/o lower abdominal pain and pressure.Checking BP's at home, reports at home bp's are 148/70's,  Reflexes 1+ NO CLONUS., Patient reports that she was on bp meds prior to pregnancy, discontinued at d/x of pregnancy.  LFT's are at Upper limits of normal, ALT 37. ast 30's,   Has no epigastric pain. Negative Murphy sign. Pr/Cr ratio 0.17 Will treat as a mild chronic hypertensive, confirm fetal wellbeing with NST today, and schedule IOL  Tomorrow at 39.0wk  As slots for IOL available.  Appt 7:30 am for IOL, will advise rechk of LFT, pih lab at initiation of induction. nst reactive.  Pt desires in-hosp  BTL and removal of navel skin tag

## 2012-08-20 NOTE — Telephone Encounter (Signed)
Preadmission screen  

## 2012-08-20 NOTE — Patient Instructions (Signed)
[facility]

## 2012-08-21 ENCOUNTER — Inpatient Hospital Stay (HOSPITAL_COMMUNITY)
Admission: RE | Admit: 2012-08-21 | Discharge: 2012-08-25 | DRG: 767 | Disposition: A | Discharge status: Home / Self Care | Payer: Medicaid Other | Source: Ambulatory Visit | Attending: Obstetrics & Gynecology | Admitting: Obstetrics & Gynecology

## 2012-08-21 VITALS — BP 120/78 | HR 66 | Temp 97.4°F | Resp 18 | Ht 66.0 in | Wt 208.0 lb

## 2012-08-21 DIAGNOSIS — O1002 Pre-existing essential hypertension complicating childbirth: Principal | ICD-10-CM | POA: Diagnosis present

## 2012-08-21 DIAGNOSIS — Z302 Encounter for sterilization: Secondary | ICD-10-CM

## 2012-08-21 DIAGNOSIS — I10 Essential (primary) hypertension: Secondary | ICD-10-CM | POA: Diagnosis present

## 2012-08-21 DIAGNOSIS — D225 Melanocytic nevi of trunk: Secondary | ICD-10-CM | POA: Diagnosis present

## 2012-08-21 LAB — COMPREHENSIVE METABOLIC PANEL
AST: 29 U/L (ref 0–37)
BUN: 6 mg/dL (ref 6–23)
CO2: 19 mEq/L (ref 19–32)
Calcium: 9.4 mg/dL (ref 8.4–10.5)
Chloride: 102 mEq/L (ref 96–112)
Creatinine, Ser: 0.45 mg/dL — ABNORMAL LOW (ref 0.50–1.10)
GFR calc Af Amer: >90 mL/min (ref >90)
GFR calc non Af Amer: >90 mL/min (ref >90)
Glucose, Bld: 87 mg/dL (ref 70–99)
Total Bilirubin: 0.4 mg/dL (ref 0.3–1.2)

## 2012-08-21 LAB — CBC
Hemoglobin: 12 g/dL (ref 12.0–15.0)
MCH: 31.1 pg (ref 26.0–34.0)
MCH: 31 pg (ref 26.0–34.0)
MCHC: 34.8 g/dL (ref 30.0–36.0)
MCHC: 34.9 g/dL (ref 30.0–36.0)
MCV: 89.4 fL (ref 78.0–100.0)
RBC: 3.86 MIL/uL — ABNORMAL LOW (ref 3.87–5.11)
RDW: 13.6 % (ref 11.5–15.5)

## 2012-08-21 LAB — PROTEIN / CREATININE RATIO, URINE
Creatinine, Urine: 74.8 mg/dL
Protein Creatinine Ratio: 0.16 — ABNORMAL HIGH (ref 0.00–0.15)
Total Protein, Urine: 11.7 mg/dL

## 2012-08-21 LAB — RPR: RPR Ser Ql: NONREACTIVE

## 2012-08-21 MED ORDER — IBUPROFEN 600 MG PO TABS: 600.0000 mg | ORAL_TABLET | Freq: Four times a day (QID) | ORAL | Status: DC | PRN

## 2012-08-21 MED ORDER — ONDANSETRON HCL 4 MG/2ML IJ SOLN
4.0000 mg | Freq: Four times a day (QID) | INTRAMUSCULAR | Status: DC | PRN
  Administered 2012-08-22: 4 mg via INTRAVENOUS
  Filled 2012-08-21: qty 2

## 2012-08-21 MED ORDER — TERBUTALINE SULFATE 1 MG/ML IJ SOLN: 0.2500 mg | Freq: Once | INTRAMUSCULAR | Status: AC | PRN

## 2012-08-21 MED ORDER — OXYTOCIN 40 UNITS IN LACTATED RINGERS INFUSION - SIMPLE MED: 1.0000 m[IU]/min | INTRAVENOUS | Status: DC

## 2012-08-21 MED ORDER — OXYTOCIN 40 UNITS IN LACTATED RINGERS INFUSION - SIMPLE MED
62.5000 mL/h | INTRAVENOUS | Status: DC
  Administered 2012-08-23: 62.5 mL/h via INTRAVENOUS
  Filled 2012-08-21: qty 1000

## 2012-08-21 MED ORDER — ACETAMINOPHEN 325 MG PO TABS: 650.0000 mg | ORAL_TABLET | ORAL | Status: DC | PRN

## 2012-08-21 MED ORDER — CITRIC ACID-SODIUM CITRATE 334-500 MG/5ML PO SOLN
30.0000 mL | ORAL | Status: DC | PRN
  Filled 2012-08-21: qty 15

## 2012-08-21 MED ORDER — FENTANYL CITRATE 0.05 MG/ML IJ SOLN
100.0000 ug | INTRAMUSCULAR | Status: DC | PRN
  Administered 2012-08-21: 100 ug via INTRAVENOUS
  Filled 2012-08-21: qty 2

## 2012-08-21 MED ORDER — SODIUM CHLORIDE 0.9 % IJ SOLN: 3.0000 mL | INTRAMUSCULAR | Status: DC | PRN

## 2012-08-21 MED ORDER — LACTATED RINGERS IV SOLN
500.0000 mL | INTRAVENOUS | Status: DC | PRN
  Administered 2012-08-22: 1000 mL via INTRAVENOUS

## 2012-08-21 MED ORDER — LIDOCAINE HCL (PF) 1 % IJ SOLN
30.0000 mL | INTRAMUSCULAR | Status: DC | PRN
  Filled 2012-08-21: qty 30

## 2012-08-21 MED ORDER — LACTATED RINGERS IV SOLN
INTRAVENOUS | Status: DC
  Administered 2012-08-21 – 2012-08-22 (×5): via INTRAVENOUS

## 2012-08-21 MED ORDER — OXYTOCIN BOLUS FROM INFUSION: 500.0000 mL | INTRAVENOUS | Status: DC

## 2012-08-21 MED ORDER — OXYCODONE-ACETAMINOPHEN 5-325 MG PO TABS: 1.0000 | ORAL_TABLET | ORAL | Status: DC | PRN

## 2012-08-21 MED ORDER — NALBUPHINE HCL 10 MG/ML IJ SOLN
10.0000 mg | INTRAMUSCULAR | Status: DC | PRN
  Filled 2012-08-21 (×2): qty 1

## 2012-08-21 MED ORDER — FLEET ENEMA 7-19 GM/118ML RE ENEM: 1.0000 | ENEMA | Freq: Every day | RECTAL | Status: DC | PRN

## 2012-08-21 MED ORDER — NALBUPHINE SYRINGE 5 MG/0.5 ML
10.0000 mg | INJECTION | INTRAMUSCULAR | Status: DC | PRN
  Administered 2012-08-21: 10 mg via INTRAVENOUS
  Filled 2012-08-21: qty 1

## 2012-08-21 MED ORDER — OXYTOCIN 40 UNITS IN LACTATED RINGERS INFUSION - SIMPLE MED
1.0000 m[IU]/min | INTRAVENOUS | Status: DC
  Administered 2012-08-21: 1 m[IU]/min via INTRAVENOUS
  Filled 2012-08-21: qty 1000

## 2012-08-21 NOTE — H&P (Signed)
Bonnie Jones is a 32 y.o. female presenting for IOL for CHTN. History 31yo G3P1011 at [redacted]w[redacted]d. IOL for CHTN, blood pressures in the 120-130s/80 range. Membranes intact. No headaches, dizziness, vision changes, pain.  Some contractions, no bleeding or loss of fluid. Baby moving normally.  AST 31 and ALT 38 on 7/8.   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 Hypertension   . Weight decrease   . Asthma     as child  . GERD (gastroesophageal reflux disease)   . Pregnant   . Abnormal Pap smear    Past Surgical History  Procedure Laterality Date  . Tonsillectomy and adenoidectomy    . Mass excision  02/02/2011    Procedure: EXCISION MASS;  Surgeon: Jetty Duhamel, MD;  Location: [facility] OR;  Service: General;  Laterality: Left;  Excision of LEFT UPPER BACK MASS  . Back surgery    . Tonsillectomy     Family History: family history includes Cancer in her mother; Diabetes in her mother; Heart disease in her mother; and Hypertension in her mother. Social History:  reports that she has quit smoking. Her smoking use included Cigarettes. She has a 10 pack-year smoking history. She quit smokeless tobacco use about 4 months ago. She reports that she does not drink alcohol or use illicit drugs.   Prenatal Transfer Tool  Maternal Diabetes: No Genetic Screening: Abnormal:  Results: Other: NT positive, harmony negative Maternal Ultrasounds/Referrals: Normal Fetal Ultrasounds or other Referrals:  None Maternal Substance Abuse:  No Significant Maternal Medications:  None Significant Maternal Lab Results:  Lab values include: Group B Strep negative Other Comments:  None  ROS negative except as above    Blood pressure 135/55,  pulse 76, temperature 98.1 F (36.7 C), temperature source Oral, resp. rate 18, height 5\' 6"  (1.676 m), weight 94.348 kg (208 lb), last menstrual period 11/22/2011. Exam Physical Exam  General appearance: alert and no distress Head: Normocephalic, without obvious abnormality, atraumatic Lungs: clear to auscultation bilaterally Heart: regular rate and rhythm, S1, S2 normal, no murmur, click, rub or gallop Abdomen: gravid, nontender Extremities: no edema, redness or tenderness in the calves or thighs Pulses: 2+ and symmetric Reflexes: 1+ patellar and achilles bilaterally  Dilation: 1 Effacement (%): 30 Cervical Position: Posterior Station: -2 Presentation: Vertex Exam by:: Dr. Thad Ranger  FHT: 130bpm, mod var, 15x15 accels present, no decels  Prenatal labs: ABO, Rh: B/Positive/-- (12/12 0000) Antibody: NEG (04/22 0923) Rubella: Immune (12/12 0000) RPR: NON REAC (04/22 0923)  HBsAg: Negative (12/12 0000)  HIV: NON REACTIVE (04/22 0923)  GBS: NEGATIVE (06/26 1416)   Assessment/Plan: 31yo G3P1011 [redacted]w[redacted]d here for IOL due to chronic hypertension  Foley bulb placed Pitocin on 1x1 until bulb is out, then 2x2  PIH labs, magnesium if severe features  Tawni Carnes 08/21/2012, 9:15 AM  I saw and examined patient and agree with above resident note. I reviewed history, imaging, labs, and vitals. I personally reviewed the fetal heart tracing, and it is reactive. Napoleon Form, MD\

## 2012-08-21 NOTE — Progress Notes (Signed)
Patient ID: Bonnie Jones, female   DOB: [birth year], 32 y.o.   MRN: [id number]   S:  Pt feeling some discomfort with ctx, has asked for IV pain meds  O:   Filed Vitals:   08/21/12 1904 08/21/12 1945 08/21/12 2019 08/21/12 2101  BP: 116/64 128/62 137/75 128/59  Pulse: 75 66 68 72  Temp:  98.2 F (36.8 C)    TempSrc:  Oral    Resp:  18 18 18   Height:      Weight:      SpO2:        Dilation: 4 Effacement (%): 50 Cervical Position: Posterior Station: -3 Presentation: Vertex Exam by:: dr. Anuradha Chabot  FHTs:  120-130, mod var, accels present, no decels TOCO:  q 2-5 min  A/P 32 y.o. G3P1011 at [redacted]w[redacted]d here for IOL for Chronic HTN - BP controlled, PIH labs negative - FHTs reactive - Not in active labor - Continue to increase pitocin  Napoleon Form, MD

## 2012-08-22 ENCOUNTER — Inpatient Hospital Stay (HOSPITAL_COMMUNITY): Payer: Medicaid Other | Admitting: Anesthesiology

## 2012-08-22 ENCOUNTER — Encounter (HOSPITAL_COMMUNITY): Payer: Self-pay | Admitting: Anesthesiology

## 2012-08-22 ENCOUNTER — Encounter (HOSPITAL_COMMUNITY): Payer: Self-pay

## 2012-08-22 LAB — CBC
Platelets: 200 10*3/uL (ref 150–400)
RBC: 3.74 MIL/uL — ABNORMAL LOW (ref 3.87–5.11)
RDW: 13.9 % (ref 11.5–15.5)
WBC: 9.9 10*3/uL (ref 4.0–10.5)

## 2012-08-22 MED ORDER — LACTATED RINGERS IV SOLN: 500.0000 mL | Freq: Once | INTRAVENOUS | Status: DC

## 2012-08-22 MED ORDER — PHENYLEPHRINE 40 MCG/ML (10ML) SYRINGE FOR IV PUSH (FOR BLOOD PRESSURE SUPPORT): 80.0000 ug | PREFILLED_SYRINGE | INTRAVENOUS | Status: DC | PRN

## 2012-08-22 MED ORDER — DIPHENHYDRAMINE HCL 50 MG/ML IJ SOLN
12.5000 mg | INTRAMUSCULAR | Status: DC | PRN
  Administered 2012-08-22: 12.5 mg via INTRAVENOUS
  Filled 2012-08-22: qty 1

## 2012-08-22 MED ORDER — FENTANYL 2.5 MCG/ML BUPIVACAINE 1/10 % EPIDURAL INFUSION (WH - ANES)
14.0000 mL/h | INTRAMUSCULAR | Status: DC | PRN
  Administered 2012-08-22 (×3): 14 mL/h via EPIDURAL
  Filled 2012-08-22 (×3): qty 125

## 2012-08-22 MED ORDER — EPHEDRINE 5 MG/ML INJ: 10.0000 mg | INTRAVENOUS | Status: DC | PRN

## 2012-08-22 MED ORDER — PHENYLEPHRINE 40 MCG/ML (10ML) SYRINGE FOR IV PUSH (FOR BLOOD PRESSURE SUPPORT)
80.0000 ug | PREFILLED_SYRINGE | INTRAVENOUS | Status: DC | PRN
  Filled 2012-08-22: qty 5

## 2012-08-22 MED ORDER — EPHEDRINE 5 MG/ML INJ
10.0000 mg | INTRAVENOUS | Status: DC | PRN
  Filled 2012-08-22: qty 4

## 2012-08-22 MED ORDER — OXYTOCIN 40 UNITS IN LACTATED RINGERS INFUSION - SIMPLE MED
1.0000 m[IU]/min | INTRAVENOUS | Status: DC
  Administered 2012-08-22: 2 m[IU]/min via INTRAVENOUS
  Administered 2012-08-22: 32 m[IU]/min via INTRAVENOUS
  Administered 2012-08-22: 12 m[IU]/min via INTRAVENOUS
  Administered 2012-08-22: 14 m[IU]/min via INTRAVENOUS
  Filled 2012-08-22 (×2): qty 1000

## 2012-08-22 MED ORDER — LIDOCAINE HCL (PF) 1 % IJ SOLN
INTRAMUSCULAR | Status: DC | PRN
  Administered 2012-08-22 (×4): 4 mL

## 2012-08-22 NOTE — Progress Notes (Signed)
Bonnie Jones is a 32 y.o. G3P1011 at [redacted]w[redacted]d   Subjective: Comfortable with epidural  Objective: BP 124/50  Pulse 96  Temp(Src) 98.5 F (36.9 C) (Oral)  Resp 18  Ht 5\' 6"  (1.676 m)  Wt 94.348 kg (208 lb)  BMI 33.59 kg/m2  SpO2 99%  LMP 11/22/2011 I/O last 3 completed shifts: In: -  Out: 1000 [Urine:1000]    FHT:  FHR: 130 bpm, variability: moderate,  accelerations:  Present,  decelerations:  Absent UC:   regular, every 3 minutes with Pitocin at 89mu/min; MVUs adequate SVE:   Dilation: 6.5 Effacement (%): 80 Station: -2 Exam by:: [email or web address] cnm  Labs: Lab Results  Component Value Date   WBC 9.9 08/22/2012   HGB 11.6* 08/22/2012   HCT 33.7* 08/22/2012   MCV 90.1 08/22/2012   PLT 200 08/22/2012    Assessment / Plan: IUP at 39wks Early active labor  Examine cx in 2 hrs or sooner prn    SHAW, KIMBERLY 08/22/2012, 11:16 PM

## 2012-08-22 NOTE — Progress Notes (Signed)
Patient ID: Bonnie Jones, female   DOB: 1980/04/02, 32 y.o.   MRN: [id number]  S:  Pt has epidural now, comfortable  O:  Filed Vitals:   08/22/12 1015 08/22/12 1016 08/22/12 1020 08/22/12 1024  BP:  121/67    Pulse: 87 92 97   Temp:    98 F (36.7 C)  TempSrc:    Oral  Resp: 20 18  20   Height:      Weight:      SpO2: 99%  99%     Dilation: 4 Effacement (%): 50 Cervical Position: mid Station: -3 Presentation: Vertex Exam by:: dr Aeron Donaghey  AROM, clear  FHTs:  135, mod var, accels present, no decels TOCO:  q 2-5 min Pit at 16  A/P 33 y.o. G3P1011 at [redacted]w[redacted]d here for IOL for CHTN - FHTs reactive - s/p FB and pit, then pit break. Pitocin restarted this morning. Not much cervical change, but hurting enough for epidural - AROM, clear - Anticipate transition to active labor soon and SVD  Napoleon Form, MD

## 2012-08-22 NOTE — Anesthesia Preprocedure Evaluation (Signed)
Anesthesia Evaluation  Patient identified by MRN, date of birth, ID band Patient awake    Reviewed: Allergy & Precautions, H&P , NPO status , Patient's Chart, lab work & pertinent test results, reviewed documented beta blocker date and time   History of Anesthesia Complications Negative for: history of anesthetic complications  Airway Mallampati: III TM Distance: >3 FB Neck ROM: full    Dental  (+) Teeth Intact   Pulmonary neg pulmonary ROS, Current Smoker,  breath sounds clear to auscultation        Cardiovascular hypertension, Rhythm:regular Rate:Normal     Neuro/Psych negative neurological ROS  negative psych ROS   GI/Hepatic Neg liver ROS, GERD- (gaviscon prn)  Medicated,  Endo/Other  BMI 33.6  Renal/GU negative Renal ROS     Musculoskeletal   Abdominal   Peds  Hematology negative hematology ROS (+)   Anesthesia Other Findings Tongue piercing - asked to remove  Reproductive/Obstetrics (+) Pregnancy                           Anesthesia Physical Anesthesia Plan  ASA: II  Anesthesia Plan: Epidural   Post-op Pain Management:    Induction:   Airway Management Planned:   Additional Equipment:   Intra-op Plan:   Post-operative Plan:   Informed Consent: I have reviewed the patients History and Physical, chart, labs and discussed the procedure including the risks, benefits and alternatives for the proposed anesthesia with the patient or authorized representative who has indicated his/her understanding and acceptance.     Plan Discussed with:   Anesthesia Plan Comments:         Anesthesia Quick Evaluation

## 2012-08-22 NOTE — Progress Notes (Signed)
Patient ID: Bonnie Jones, female   DOB: 03-04-1980, 32 y.o.   MRN: [id number]  S:  Pt with epidural. Feeling lower vaginal/suprapubic pain.  O:   Filed Vitals:   08/22/12 1231 08/22/12 1300 08/22/12 1301 08/22/12 1331  BP: 118/61  126/90 131/70  Pulse: 73  90 69  Temp:      TempSrc:      Resp: 18 20  18   Height:      Weight:      SpO2:        Dilation: 4 Effacement (%): 70 Cervical Position: Mid to Anterior Station: -3 Presentation: Vertex Exam by:: dr [place] Lesure  FHTs: 130, mod var, accels present, no decels TOCO:  q 2-4 min  A/P IOL for CHTN, BP stable FHTs reactive No cervical change, continue increasing pitocin  Napoleon Form, MD

## 2012-08-22 NOTE — Progress Notes (Signed)
Bonnie Jones is a 32 y.o. G3P1011 at 108w1d   Subjective: Requesting something for pain; leaning towards epidural as did not like the dizziness from IV pain meds; stopped Pitocin during the night as had gotten up to 26 mu/min without discomfort- restarted at 4am  Objective: BP 126/52  Pulse 82  Temp(Src) 98.1 F (36.7 C) (Oral)  Resp 18  Ht 5\' 6"  (1.676 m)  Wt 94.348 kg (208 lb)  BMI 33.59 kg/m2  SpO2 98%  LMP 11/22/2011      FHT:  FHR: 130 bpm, variability: moderate,  accelerations:  Present,  decelerations:  Absent UC:   regular, every 3 minutes with Pitocin SVE:  Anterior 3-4/90 per Inetta Fermo RN Labs: Lab Results  Component Value Date   WBC 9.9 08/22/2012   HGB 11.6* 08/22/2012   HCT 33.7* 08/22/2012   MCV 90.1 08/22/2012   PLT 200 08/22/2012    Assessment / Plan: IOL process Early labor  Will place epidural per pt request Consider AROM later this morning as cx has almost completely effaced   SHAW, KIMBERLY 08/22/2012, 8:48 AM

## 2012-08-22 NOTE — Progress Notes (Signed)
Patient ID: Bonnie Jones, female   DOB: 1980/09/26, 32 y.o.   MRN: [id number]   S:  Pt has expressed frustration that labor is going so slowly. Comfortable with epidural  O:   Filed Vitals:   08/22/12 1532 08/22/12 1601 08/22/12 1631 08/22/12 1701  BP: 101/43 117/67 120/53 118/51  Pulse: 64 70 67 69  Temp:  97.5 F (36.4 C)    TempSrc:  Oral    Resp: 18 18 20 18   Height:      Weight:      SpO2:        Dilation: 5 Effacement (%): 80 Cervical Position: Middle Station: -2;-3 Presentation: Vertex Exam by:: dr Lyndle Herrlich change  IUPC placed  FHTs:  125-130, moderate variability, accels present, no decels TOCO:  q 4-5 minutes  A/P 32 y.o. G3P1011 at [redacted]w[redacted]d here for IOL for CHTN - FHTs reactive - BP controlled - Little progress since foley bulb out, despite pitocin at 30 - IUPC placed, will monitor MVUs, consider pitocin break  Napoleon Form, MD

## 2012-08-22 NOTE — H&P (Signed)
Attestation of Attending Supervision of Obstetric Fellow: Evaluation and management procedures were performed by the Obstetric Fellow under my supervision and collaboration.  I have reviewed the Obstetric Fellow's note and chart, and I agree with the management and plan.  Sarkis Rhines, MD, FACOG Attending Obstetrician & Gynecologist Faculty Practice, [facility]   

## 2012-08-22 NOTE — Anesthesia Procedure Notes (Signed)
Epidural Patient location during procedure: OB Start time: 08/22/2012 9:39 AM  Staffing Performed by: anesthesiologist   Preanesthetic Checklist Completed: patient identified, site marked, surgical consent, pre-op evaluation, timeout performed, IV checked, risks and benefits discussed and monitors and equipment checked  Epidural Patient position: sitting Prep: site prepped and draped and DuraPrep Patient monitoring: continuous pulse ox and blood pressure Approach: midline Injection technique: LOR air  Needle:  Needle type: Tuohy  Needle gauge: 17 G Needle length: 9 cm and 9 Needle insertion depth: 6 cm Catheter type: closed end flexible Catheter size: 19 Gauge Catheter at skin depth: 11 cm Test dose: negative  Assessment Events: blood not aspirated, injection not painful, no injection resistance, negative IV test and no paresthesia  Additional Notes Discussed risk of headache, infection, bleeding, nerve injury and failed or incomplete block.  Patient voices understanding and wishes to proceed.  Epidural placed easily on second attempt after repositioning.  No paresthesia.  Patient tolerated procedure well with no apparent complications.  Jasmine December, MD Reason for block:procedure for pain

## 2012-08-23 ENCOUNTER — Inpatient Hospital Stay (HOSPITAL_COMMUNITY): Payer: Medicaid Other

## 2012-08-23 ENCOUNTER — Encounter (HOSPITAL_COMMUNITY)
Admission: RE | Disposition: A | Discharge status: Home / Self Care | Payer: Self-pay | Source: Ambulatory Visit | Attending: Obstetrics & Gynecology

## 2012-08-23 ENCOUNTER — Encounter (HOSPITAL_COMMUNITY): Payer: Self-pay

## 2012-08-23 DIAGNOSIS — O0940 Supervision of pregnancy with grand multiparity, unspecified trimester: Secondary | ICD-10-CM

## 2012-08-23 DIAGNOSIS — Z302 Encounter for sterilization: Secondary | ICD-10-CM

## 2012-08-23 DIAGNOSIS — D225 Melanocytic nevi of trunk: Secondary | ICD-10-CM | POA: Diagnosis present

## 2012-08-23 DIAGNOSIS — O1002 Pre-existing essential hypertension complicating childbirth: Secondary | ICD-10-CM | POA: Diagnosis not present

## 2012-08-23 HISTORY — PX: TUBAL LIGATION: SHX77

## 2012-08-23 SURGERY — LIGATION, FALLOPIAN TUBE, POSTPARTUM
Anesthesia: Epidural | Site: Abdomen | Wound class: Clean

## 2012-08-23 MED ORDER — ZOLPIDEM TARTRATE 5 MG PO TABS: 5.0000 mg | ORAL_TABLET | Freq: Every evening | ORAL | Status: DC | PRN

## 2012-08-23 MED ORDER — PRENATAL MULTIVITAMIN CH
1.0000 | ORAL_TABLET | Freq: Every day | ORAL | Status: DC
  Administered 2012-08-24: 1 via ORAL
  Filled 2012-08-23: qty 1

## 2012-08-23 MED ORDER — CITRIC ACID-SODIUM CITRATE 334-500 MG/5ML PO SOLN
30.0000 mL | Freq: Once | ORAL | Status: AC
  Administered 2012-08-23: 30 mL via ORAL

## 2012-08-23 MED ORDER — LANOLIN HYDROUS EX OINT: TOPICAL_OINTMENT | CUTANEOUS | Status: DC | PRN

## 2012-08-23 MED ORDER — DEXAMETHASONE SODIUM PHOSPHATE 10 MG/ML IJ SOLN
INTRAMUSCULAR | Status: AC
  Filled 2012-08-23: qty 1

## 2012-08-23 MED ORDER — MEPERIDINE HCL 25 MG/ML IJ SOLN: 6.2500 mg | INTRAMUSCULAR | Status: DC | PRN

## 2012-08-23 MED ORDER — BUPIVACAINE HCL (PF) 0.25 % IJ SOLN
INTRAMUSCULAR | Status: AC
  Filled 2012-08-23: qty 30

## 2012-08-23 MED ORDER — FENTANYL CITRATE 0.05 MG/ML IJ SOLN
INTRAMUSCULAR | Status: AC
  Filled 2012-08-23: qty 2

## 2012-08-23 MED ORDER — FENTANYL CITRATE 0.05 MG/ML IJ SOLN
INTRAMUSCULAR | Status: DC | PRN
  Administered 2012-08-23 (×4): 25 ug via INTRAVENOUS
  Administered 2012-08-23: 50 ug via INTRAVENOUS
  Administered 2012-08-23 (×2): 25 ug via INTRAVENOUS

## 2012-08-23 MED ORDER — SIMETHICONE 80 MG PO CHEW: 80.0000 mg | CHEWABLE_TABLET | ORAL | Status: DC | PRN

## 2012-08-23 MED ORDER — LACTATED RINGERS IV SOLN
INTRAVENOUS | Status: DC | PRN
  Administered 2012-08-23 (×2): via INTRAVENOUS

## 2012-08-23 MED ORDER — ONDANSETRON HCL 4 MG PO TABS: 4.0000 mg | ORAL_TABLET | ORAL | Status: DC | PRN

## 2012-08-23 MED ORDER — PROMETHAZINE HCL 25 MG/ML IJ SOLN: 6.2500 mg | INTRAMUSCULAR | Status: DC | PRN

## 2012-08-23 MED ORDER — SODIUM CHLORIDE 0.9 % IR SOLN
Status: DC | PRN
  Administered 2012-08-23: 1000 mL

## 2012-08-23 MED ORDER — DIBUCAINE 1 % RE OINT: 1.0000 "application " | TOPICAL_OINTMENT | RECTAL | Status: DC | PRN

## 2012-08-23 MED ORDER — SENNOSIDES-DOCUSATE SODIUM 8.6-50 MG PO TABS
2.0000 | ORAL_TABLET | Freq: Every day | ORAL | Status: DC
  Administered 2012-08-23 – 2012-08-24 (×2): 2 via ORAL

## 2012-08-23 MED ORDER — BENZOCAINE-MENTHOL 20-0.5 % EX AERO: 1.0000 "application " | INHALATION_SPRAY | CUTANEOUS | Status: DC | PRN

## 2012-08-23 MED ORDER — IBUPROFEN 600 MG PO TABS
600.0000 mg | ORAL_TABLET | Freq: Four times a day (QID) | ORAL | Status: DC
  Administered 2012-08-23 – 2012-08-25 (×8): 600 mg via ORAL
  Filled 2012-08-23 (×4): qty 1

## 2012-08-23 MED ORDER — WITCH HAZEL-GLYCERIN EX PADS: 1.0000 "application " | MEDICATED_PAD | CUTANEOUS | Status: DC | PRN

## 2012-08-23 MED ORDER — MEASLES, MUMPS & RUBELLA VAC ~~LOC~~ INJ
0.5000 mL | INJECTION | Freq: Once | SUBCUTANEOUS | Status: DC
  Filled 2012-08-23: qty 0.5

## 2012-08-23 MED ORDER — ONDANSETRON HCL 4 MG/2ML IJ SOLN: 4.0000 mg | INTRAMUSCULAR | Status: DC | PRN

## 2012-08-23 MED ORDER — DIPHENHYDRAMINE HCL 25 MG PO CAPS: 25.0000 mg | ORAL_CAPSULE | Freq: Four times a day (QID) | ORAL | Status: DC | PRN

## 2012-08-23 MED ORDER — BUPIVACAINE HCL (PF) 0.25 % IJ SOLN
INTRAMUSCULAR | Status: DC | PRN
  Administered 2012-08-23: 10 mL

## 2012-08-23 MED ORDER — MIDAZOLAM HCL 2 MG/2ML IJ SOLN
INTRAMUSCULAR | Status: AC
Start: 2012-08-23 — End: 2012-08-23
  Filled 2012-08-23: qty 2

## 2012-08-23 MED ORDER — TETANUS-DIPHTH-ACELL PERTUSSIS 5-2.5-18.5 LF-MCG/0.5 IM SUSP
0.5000 mL | Freq: Once | INTRAMUSCULAR | Status: AC
  Administered 2012-08-23: 0.5 mL via INTRAMUSCULAR

## 2012-08-23 MED ORDER — PRENATAL MULTIVITAMIN CH: 1.0000 | ORAL_TABLET | Freq: Every day | ORAL | Status: DC

## 2012-08-23 MED ORDER — SENNOSIDES-DOCUSATE SODIUM 8.6-50 MG PO TABS: 2.0000 | ORAL_TABLET | Freq: Every day | ORAL | Status: DC

## 2012-08-23 MED ORDER — IBUPROFEN 600 MG PO TABS
600.0000 mg | ORAL_TABLET | Freq: Four times a day (QID) | ORAL | Status: DC
  Filled 2012-08-23 (×4): qty 1

## 2012-08-23 MED ORDER — SODIUM BICARBONATE 8.4 % IV SOLN
INTRAVENOUS | Status: AC
  Filled 2012-08-23: qty 50

## 2012-08-23 MED ORDER — OXYCODONE-ACETAMINOPHEN 5-325 MG PO TABS
1.0000 | ORAL_TABLET | ORAL | Status: DC | PRN
  Administered 2012-08-23 – 2012-08-24 (×5): 2 via ORAL
  Filled 2012-08-23: qty 2

## 2012-08-23 MED ORDER — LIDOCAINE-EPINEPHRINE (PF) 2 %-1:200000 IJ SOLN
INTRAMUSCULAR | Status: AC
  Filled 2012-08-23: qty 20

## 2012-08-23 MED ORDER — ONDANSETRON HCL 4 MG/2ML IJ SOLN
INTRAMUSCULAR | Status: DC | PRN
  Administered 2012-08-23: 4 mg via INTRAVENOUS

## 2012-08-23 MED ORDER — MIDAZOLAM HCL 2 MG/2ML IJ SOLN
INTRAMUSCULAR | Status: AC
  Filled 2012-08-23: qty 2

## 2012-08-23 MED ORDER — ONDANSETRON HCL 4 MG/2ML IJ SOLN
INTRAMUSCULAR | Status: AC
  Filled 2012-08-23: qty 2

## 2012-08-23 MED ORDER — KETOROLAC TROMETHAMINE 30 MG/ML IJ SOLN: 15.0000 mg | Freq: Once | INTRAMUSCULAR | Status: DC | PRN

## 2012-08-23 MED ORDER — OXYCODONE-ACETAMINOPHEN 5-325 MG PO TABS
1.0000 | ORAL_TABLET | ORAL | Status: DC | PRN
  Administered 2012-08-23: 2 via ORAL
  Filled 2012-08-23 (×5): qty 2

## 2012-08-23 MED ORDER — TETANUS-DIPHTH-ACELL PERTUSSIS 5-2.5-18.5 LF-MCG/0.5 IM SUSP: 0.5000 mL | Freq: Once | INTRAMUSCULAR | Status: DC

## 2012-08-23 MED ORDER — SODIUM BICARBONATE 8.4 % IV SOLN
INTRAVENOUS | Status: DC | PRN
  Administered 2012-08-23 (×4): 5 mL via EPIDURAL

## 2012-08-23 MED ORDER — MIDAZOLAM HCL 5 MG/5ML IJ SOLN
INTRAMUSCULAR | Status: DC | PRN
  Administered 2012-08-23 (×6): 0.5 mg via INTRAVENOUS
  Administered 2012-08-23: 1 mg via INTRAVENOUS

## 2012-08-23 MED ORDER — DEXAMETHASONE SODIUM PHOSPHATE 10 MG/ML IJ SOLN
INTRAMUSCULAR | Status: DC | PRN
  Administered 2012-08-23: 10 mg via INTRAVENOUS

## 2012-08-23 MED ORDER — HYDROMORPHONE HCL PF 1 MG/ML IJ SOLN: 0.2500 mg | INTRAMUSCULAR | Status: DC | PRN

## 2012-08-23 SURGICAL SUPPLY — 20 items
CHLORAPREP W/TINT 26ML (MISCELLANEOUS) ×2 IMPLANT
CLIP FILSHIE TUBAL LIGA STRL (Clip) ×4 IMPLANT
CLOTH BEACON ORANGE TIMEOUT ST (SAFETY) ×2 IMPLANT
DRSG COVADERM PLUS 2X2 (GAUZE/BANDAGES/DRESSINGS) ×2 IMPLANT
GLOVE BIOGEL PI IND STRL 7.0 (GLOVE) ×2 IMPLANT
GLOVE BIOGEL PI INDICATOR 7.0 (GLOVE) ×2
GLOVE ECLIPSE 7.0 STRL STRAW (GLOVE) ×4 IMPLANT
GOWN PREVENTION PLUS LG XLONG (DISPOSABLE) ×2 IMPLANT
GOWN PREVENTION PLUS XLARGE (GOWN DISPOSABLE) ×2 IMPLANT
NEEDLE HYPO 22GX1.5 SAFETY (NEEDLE) ×2 IMPLANT
NS IRRIG 1000ML POUR BTL (IV SOLUTION) ×2 IMPLANT
PACK ABDOMINAL MINOR (CUSTOM PROCEDURE TRAY) ×2 IMPLANT
SPONGE LAP 4X18 X RAY DECT (DISPOSABLE) ×2 IMPLANT
SUT VIC AB 0 CT1 27 (SUTURE) ×1
SUT VIC AB 0 CT1 27XBRD ANBCTR (SUTURE) ×1 IMPLANT
SUT VICRYL 4-0 PS2 18IN ABS (SUTURE) ×2 IMPLANT
SYR CONTROL 10ML LL (SYRINGE) ×2 IMPLANT
TOWEL OR 17X24 6PK STRL BLUE (TOWEL DISPOSABLE) ×4 IMPLANT
TRAY FOLEY CATH 14FR (SET/KITS/TRAYS/PACK) ×2 IMPLANT
WATER STERILE IRR 1000ML POUR (IV SOLUTION) IMPLANT

## 2012-08-23 NOTE — Anesthesia Preprocedure Evaluation (Signed)
Anesthesia Evaluation  Patient identified by MRN, date of birth, ID band Patient awake    Reviewed: Allergy & Precautions, H&P , NPO status , Patient's Chart, lab work & pertinent test results, reviewed documented beta blocker date and time   History of Anesthesia Complications Negative for: history of anesthetic complications  Airway Mallampati: III TM Distance: >3 FB Neck ROM: full    Dental  (+) Teeth Intact   Pulmonary neg pulmonary ROS, Current Smoker,  breath sounds clear to auscultation        Cardiovascular hypertension, Rhythm:regular Rate:Normal     Neuro/Psych negative neurological ROS  negative psych ROS   GI/Hepatic Neg liver ROS, GERD- (gaviscon prn)  Medicated,  Endo/Other  BMI 33.6  Renal/GU negative Renal ROS     Musculoskeletal   Abdominal   Peds  Hematology negative hematology ROS (+)   Anesthesia Other Findings Tongue piercing - asked to remove  Reproductive/Obstetrics (+) Pregnancy                           Anesthesia Physical  Anesthesia Plan  ASA: II  Anesthesia Plan: Epidural   Post-op Pain Management:    Induction:   Airway Management Planned:   Additional Equipment:   Intra-op Plan:   Post-operative Plan:   Informed Consent: I have reviewed the patients History and Physical, chart, labs and discussed the procedure including the risks, benefits and alternatives for the proposed anesthesia with the patient or authorized representative who has indicated his/her understanding and acceptance.     Plan Discussed with: CRNA and Surgeon  Anesthesia Plan Comments: (Epidural In - situ for PPTL.)        Anesthesia Quick Evaluation

## 2012-08-23 NOTE — Interval H&P Note (Signed)
History and Physical Interval Note:  08/23/2012 8:45 AM  Bonnie Jones  has presented today for surgery, with the diagnosis of Desires Permanent Sterilization  The various methods of treatment have been discussed with the patient and family. After consideration of risks, benefits and other options for treatment, the patient has consented to  Procedure(s): POST PARTUM TUBAL LIGATION (N/A) as a surgical intervention .  The patient's history has been reviewed, patient examined, she is now s/p SVD, and stable for surgery.  I have reviewed the patient's chart and labs.  Questions were answered to the patient's satisfaction.  Patient counseled, r.e. Risks benefits of BTL, including permanency of procedure, risk of failure(1:100), increased risk of ectopic. Patient desires mole removal.  Patient verbalized understanding and desires to proceed    Shantara Goosby S

## 2012-08-23 NOTE — Transfer of Care (Signed)
Immediate Anesthesia Transfer of Care Note  Patient: Bonnie Jones  Procedure(s) Performed: Procedure(s): POST PARTUM TUBAL LIGATION with filshie clips and removal of nevi (N/A)  Patient Location: PACU  Anesthesia Type:Epidural  Level of Consciousness: awake, alert  and oriented  Airway & Oxygen Therapy: Patient Spontanous Breathing and Patient connected to nasal cannula oxygen  Post-op Assessment: Report given to PACU RN and Post -op Vital signs reviewed and stable  Post vital signs: Reviewed and stable  Complications: No apparent anesthesia complications

## 2012-08-23 NOTE — Op Note (Signed)
Bonnie Jones  08/21/2012 - 08/23/2012  PREOPERATIVE DIAGNOSIS:  Multiparity, undesired fertility, atypical nevus  POSTOPERATIVE DIAGNOSIS:  Multiparity, undesired fertility, atypical nevus  PROCEDURE:  Postpartum Bilateral Tubal Sterilization using Filshie Clips   ANESTHESIA:  Epidural and local analgesia using 0.25% Marcaine  COMPLICATIONS:  None immediate.  ESTIMATED BLOOD LOSS: 5 ml.  INDICATIONS: 32 y.o. [id number]  with undesired fertility,status post vaginal delivery, desires permanent sterilization.  Other reversible forms of contraception were discussed with patient; she declines all other modalities. Risks of procedure discussed with patient including but not limited to: risk of regret, permanence of method, bleeding, infection, injury to surrounding organs and need for additional procedures.  Failure risk of 0.5-1% with increased risk of ectopic gestation if pregnancy occurs was also discussed with patient.     FINDINGS:  Normal uterus, tortuous tubes with some omental adhesions, and normal ovaries.  PROCEDURE DETAILS: The patient was taken to the operating room where her spinal anesthesia was dosed up to surgical level and found to be adequate.  She was then placed in a supine position and prepped and draped in the usual sterile fashion.  After an adequate timeout was performed, attention was turned to the patient's abdomen where a small transverse skin incision was made under the umbilical fold. The incision was taken down to the layer of fascia using the scalpel, and fascia was incised, and extended bilaterally. The peritoneum was entered in a sharp fashion. The patient was placed in Trendelenburg. The left fallopian tube was identified and grasped with a Babcock clamp, and the fimbriated end was noted.  Tracing it back to its origin was difficult, so a Filshie clip was placed on the left fallopian tube about 5 cm from the fimbriated end.  A similar process was carried out on the  right side allowing for bilateral tubal sterilization.  Good hemostasis was noted overall.  Local analgesia was injected into both Filshie application sites.The instruments were then removed from the patient's abdomen and the fascial incision was repaired with 0 Vicryl, and the skin was closed with a 4-0 Vicryl subcuticular stitch. A 4 mm nevus was removed from the umbilicus.  It was elevated with pick-ups and shaved off with the knife.  Silver Nitrate used to achieve hemostasis here. The patient tolerated the procedure well.  Sponge, lap, and needle counts were correct times two.  The patient was then taken to the recovery room awake, extubated and in stable condition.  Julian Askin S MD 08/23/2012 10:00 AM

## 2012-08-23 NOTE — Anesthesia Postprocedure Evaluation (Signed)
Anesthesia Post Note  Patient: Bonnie Jones  Procedure(s) Performed: Procedure(s) (LRB): POST PARTUM TUBAL LIGATION with filshie clips and removal of nevi (N/A)  Anesthesia type: Epidural  Patient location: PACU  Post pain: Pain level controlled  Post assessment: Post-op Vital signs reviewed  Last Vitals:  Filed Vitals:   08/23/12 0849  BP: 141/54  Pulse: 91  Temp: 36.7 C  Resp: 18    Post vital signs: Reviewed  Level of consciousness: awake  Complications: No apparent anesthesia complications

## 2012-08-23 NOTE — OR Nursing (Signed)
Filshie clips applied to right and left fallopian tubes by Dr. Gildardo Griffes on 08/23/2012. Lot number -[id number]. Expiation date -01/2015. Manufacture -[employer].

## 2012-08-24 MED ORDER — IBUPROFEN 600 MG PO TABS: 600.0000 mg | ORAL_TABLET | Freq: Four times a day (QID) | ORAL | Status: DC | PRN

## 2012-08-24 MED ORDER — OXYCODONE-ACETAMINOPHEN 5-325 MG PO TABS: 1.0000 | ORAL_TABLET | ORAL | Status: DC | PRN

## 2012-08-24 NOTE — Progress Notes (Signed)
Interim progress note  Received call from patient's RN that baby was not ready to be discharged and patient wanted to stay, requesting that I discontinue discharge order. Order discontinued.  Leona Singleton, MD 08/24/2012 11:06 AM

## 2012-08-24 NOTE — Discharge Summary (Signed)
Obstetric Discharge Summary Reason for Admission: induction of labor d/t CHTN Prenatal Procedures: NST and ultrasound Intrapartum Procedures: spontaneous vaginal delivery Postpartum Procedures: P.P. tubal ligation Complications-Operative and Postpartum: none Hemoglobin  Date Value Range Status  08/22/2012 11.6* 12.0 - 15.0 g/dL Final     HCT  Date Value Range Status  08/22/2012 33.7* 36.0 - 46.0 % Final    Physical Exam:  General: alert, cooperative and no distress Lochia: appropriate Uterine Fundus: firm Incision: healing well, no significant drainage, no dehiscence, no significant erythema DVT Evaluation: No evidence of DVT seen on physical exam. Negative Homan's sign. No cords or calf tenderness. 2+ BLE edema  Discharge Diagnoses: Term Pregnancy-delivered, PP BTL  Discharge Information: Date: 08/24/2012 Activity: pelvic rest Diet: routine Medications: PNV, Ibuprofen and Percocet Condition: stable Instructions: refer to practice specific booklet Discharge to: home Follow-up Information   Follow up with [facility]. Schedule an appointment as soon as possible for a visit in 6 weeks. (for your postpartum visit)    Contact information:   [address] [place] [address] [phone]      Newborn Data: Live born female  Birth Weight: 7 lb 13 oz (3544 g) APGAR: 8, 9  Home with mother pending d/c from peds Bottlefeeding, s/p BTL for contraception, desires circumcision at FT   Marge Duncans 08/24/2012, 7:33 AM

## 2012-08-24 NOTE — Anesthesia Postprocedure Evaluation (Signed)
Anesthesia Post Note  Patient: Bonnie Jones  Procedure(s) Performed: * No procedures listed *  Anesthesia type: Epidural  Patient location: Mother/Baby  Post pain: Pain level controlled  Post assessment: Post-op Vital signs reviewed  Last Vitals:  Filed Vitals:   08/24/12 0430  BP: 120/60  Pulse: 70  Temp: 36.9 C  Resp: 18    Post vital signs: Reviewed  Level of consciousness: awake  Complications: No apparent anesthesia complications

## 2012-08-25 NOTE — Progress Notes (Signed)
UR chart review completed.  

## 2012-08-25 NOTE — Discharge Summary (Signed)
Obstetric Discharge Summary Reason for Admission: induction of labor Prenatal Procedures: NST Intrapartum Procedures: spontaneous vaginal delivery Postpartum Procedures: P.P. tubal ligation Complications-Operative and Postpartum: none Hemoglobin  Date Value Range Status  08/22/2012 11.6* 12.0 - 15.0 g/dL Final     HCT  Date Value Range Status  08/22/2012 33.7* 36.0 - 46.0 % Final  Hospital Course: Bonnie Jones is a 32 y.o. female presenting for IOL for CHTN.  History  31yo G3P1011 at [redacted]w[redacted]d. IOL for CHTN, blood pressures in the 120-130s/80 range. Membranes intact. No headaches, dizziness, vision changes, pain. Some contractions, no bleeding or loss of fluid. Baby moving normally.  AST 31 and ALT 38 on 7/8.     Delivery Note  At 3:34 AM a viable female was delivered via Vaginal, Spontaneous Delivery (Presentation:vertex ; LOA ). APGAR:8 ,9 ; weight pending .  Placenta status: spontaneous ,intact . Cord: 3 vessel with the following complications:none .  Anesthesia: Epidural  Episiotomy: none  Lacerations: none  Est. Blood Loss (mL):  Mom to postpartum. Baby to nursery-stable.  Roxan Hockey, Unique  08/23/2012, 3:46 AM   Sol [facility]  08/21/2012 - 08/23/2012  PREOPERATIVE DIAGNOSIS: Multiparity, undesired fertility, atypical nevus  POSTOPERATIVE DIAGNOSIS: Multiparity, undesired fertility, atypical nevus  PROCEDURE: Postpartum Bilateral Tubal Sterilization using Filshie Clips  ANESTHESIA: Epidural and local analgesia using 0.25% Marcaine  COMPLICATIONS: None immediate.  ESTIMATED BLOOD LOSS: 5 ml.    Physical Exam:  General: alert, cooperative and no distress Lochia: appropriate Uterine Fundus: firm Incision: healing well, no significant drainage, no dehiscence DVT Evaluation: No evidence of DVT seen on physical exam.  Discharge Diagnoses: Term Pregnancy-delivered and S/P BTL  Discharge Information: Date: 08/25/2012 Activity: pelvic rest Diet: routine Medications:  PNV, Ibuprofen and Percocet Condition: stable and improved Instructions: refer to practice specific booklet Discharge to: home Follow-up Information   Follow up with [facility]. Schedule an appointment as soon as possible for a visit in 6 weeks. (for your postpartum visit)    Contact information:   [address] [place] [address] [phone]      Newborn Data: Live born female  Birth Weight: 7 lb 13 oz (3544 g) APGAR: 8, 9  Home with mother.  [facility] 08/25/2012, 7:33 AM

## 2012-08-26 ENCOUNTER — Encounter (HOSPITAL_COMMUNITY): Payer: Self-pay | Admitting: Family Medicine

## 2012-08-28 ENCOUNTER — Ambulatory Visit (INDEPENDENT_AMBULATORY_CARE_PROVIDER_SITE_OTHER): Payer: Medicaid Other | Admitting: Obstetrics & Gynecology

## 2012-08-28 ENCOUNTER — Telehealth: Payer: Self-pay | Admitting: *Deleted

## 2012-08-28 ENCOUNTER — Encounter: Payer: Self-pay | Admitting: Obstetrics & Gynecology

## 2012-08-28 VITALS — BP 140/84 | Ht 65.0 in | Wt 199.0 lb

## 2012-08-28 DIAGNOSIS — R609 Edema, unspecified: Secondary | ICD-10-CM

## 2012-08-28 DIAGNOSIS — IMO0002 Reserved for concepts with insufficient information to code with codable children: Secondary | ICD-10-CM

## 2012-08-28 MED ORDER — TRIAMTERENE-HCTZ 37.5-25 MG PO TABS: 1.0000 | ORAL_TABLET | Freq: Every day | ORAL | Status: DC

## 2012-08-28 MED ORDER — LIDOCAINE HCL 2 % EX GEL: CUTANEOUS | Status: DC | PRN

## 2012-08-28 NOTE — Progress Notes (Signed)
Patient ID: Bonnie Jones, female   DOB: 10-16-1980, 32 y.o.   MRN: [id number] Patient with increased fluid in extremities maxzide 37.5/25 po daily Lidocaine jelly 2%

## 2012-08-28 NOTE — Telephone Encounter (Signed)
Pt states she has had more swelling since delivery, feet are swelling really bad, hurts to walk. Private area is very painful, no tear, swelling since delivery. Not too much now. Pt states it hurts from the inside. Pain medication not working. Pt given appointment today at 3:45.

## 2012-09-16 ENCOUNTER — Ambulatory Visit (INDEPENDENT_AMBULATORY_CARE_PROVIDER_SITE_OTHER): Payer: Medicaid Other | Admitting: Adult Health

## 2012-09-16 ENCOUNTER — Encounter: Payer: Self-pay | Admitting: Adult Health

## 2012-09-16 VITALS — BP 140/76 | Ht 66.0 in | Wt 189.5 lb

## 2012-09-16 DIAGNOSIS — O26899 Other specified pregnancy related conditions, unspecified trimester: Secondary | ICD-10-CM

## 2012-09-16 DIAGNOSIS — L089 Local infection of the skin and subcutaneous tissue, unspecified: Secondary | ICD-10-CM

## 2012-09-16 DIAGNOSIS — O909 Complication of the puerperium, unspecified: Secondary | ICD-10-CM

## 2012-09-16 HISTORY — DX: Local infection of the skin and subcutaneous tissue, unspecified: L08.9

## 2012-09-16 MED ORDER — SULFAMETHOXAZOLE-TMP DS 800-160 MG PO TABS: 1.0000 | ORAL_TABLET | Freq: Two times a day (BID) | ORAL | Status: DC

## 2012-09-16 NOTE — Patient Instructions (Addendum)
Skin Infections A skin infection usually develops as a result of disruption of the skin barrier.  CAUSES  A skin infection might occur following:  Trauma or an injury to the skin such as a cut or insect sting.  Inflammation (as in eczema).  Breaks in the skin between the toes (as in athlete's foot).  Swelling (edema). SYMPTOMS  The legs are the most common site affected. Usually there is:  Redness.  Swelling.  Pain.  There may be red streaks in the area of the infection. TREATMENT   Minor skin infections may be treated with topical antibiotics, but if the skin infection is severe, hospital care and intravenous (IV) antibiotic treatment may be needed.  Most often skin infections can be treated with oral antibiotic medicine as well as proper rest and elevation of the affected area until the infection improves.  If you are prescribed oral antibiotics, it is important to take them as directed and to take all the pills even if you feel better before you have finished all of the medicine.  You may apply warm compresses to the area for 20-30 minutes 4 times daily. You might need a tetanus shot now if:  You have no idea when you had the last one.  You have never had a tetanus shot before.  Your wound had dirt in it. If you need a tetanus shot and you decide not to get one, there is a rare chance of getting tetanus. Sickness from tetanus can be serious. If you get a tetanus shot, your arm may swell and become red and warm at the shot site. This is common and not a problem. SEEK MEDICAL CARE IF:  The pain and swelling from your infection do not improve within 2 days.  SEEK IMMEDIATE MEDICAL CARE IF:  You develop a fever, chills, or other serious problems.  Document Released: 03/08/2004 Document Revised: 04/23/2011 Document Reviewed: 01/19/2008 [facility] Patient Information 2014 [place], [place]. Take septra ds and use triple antibiotic ointment 2 x daily and keep band aide  on Recheck 1 week

## 2012-09-16 NOTE — Progress Notes (Signed)
Subjective:     Patient ID: Bonnie Jones, female   DOB: March 09, 1980, 32 y.o.   MRN: [id number]  HPI Bonnie Jones had a BTL after her delivery 7/12 and she says it is draining at her belly button and looks open to her.  Review of Systems See HPI Reviewed past medical,surgical, social and family history. Reviewed medications and allergies.     Objective:   Physical Exam BP 140/76  Ht 5\' 6"  (1.676 m)  Wt 189 lb 8 oz (85.957 kg)  BMI 30.6 kg/m2  Breastfeeding? No   Her incision is red and has yellowish crust and blister,no odor noted,culture obtained, cleansed with H2O2 and then triple antibiotic cream and band aide applied.  Assessment:      Skin infection at tubal site    Plan:    Check wound culture Rx septra ds 1 bid x 10 days Use triple antibiotic ointment bid Do not use alcohol  Return in 1 week for recheck

## 2012-09-19 ENCOUNTER — Telehealth: Payer: Self-pay | Admitting: Obstetrics & Gynecology

## 2012-09-19 LAB — WOUND CULTURE

## 2012-09-19 NOTE — Telephone Encounter (Signed)
Pt states taking the ABX as prescribed.

## 2012-09-19 NOTE — Telephone Encounter (Signed)
Urine culture positive, pt has prescription for Bactrim, make sure she is taking it

## 2012-09-23 ENCOUNTER — Ambulatory Visit: Payer: Medicaid Other | Admitting: Adult Health

## 2012-10-01 ENCOUNTER — Ambulatory Visit (INDEPENDENT_AMBULATORY_CARE_PROVIDER_SITE_OTHER): Payer: Medicaid Other | Admitting: Advanced Practice Midwife

## 2012-10-01 ENCOUNTER — Encounter: Payer: Self-pay | Admitting: Advanced Practice Midwife

## 2012-10-01 NOTE — Progress Notes (Signed)
Bonnie Jones is a 32 y.o. who presents for a postpartum visit. She is 6 weeks postpartum following a spontaneous vaginal delivery. I have fully reviewed the prenatal and intrapartum course. The delivery was at 39.2 gestational weeks, IOL for GHTN.  Anesthesia: epidural. Postpartum course has been complicated by pp UTI, wound (BTL) infection.. Baby's course has been uneventful. Baby is feeding by bottle. Bleeding: no bleeding. Bowel function is normal. Bladder function is normal. Patient is not sexually active. Contraception method is tubal ligation.done in hospital Postpartum depression screening: negative.  Took Maxide for a month after delivery.    Review of Systems   Constitutional: Negative for fever and chills Eyes: Negative for visual disturbances Respiratory: Negative for shortness of breath, dyspnea Cardiovascular: Negative for chest pain or palpitations  Gastrointestinal: Negative for vomiting, diarrhea and constipation Genitourinary: Negative for dysuria and urgency Musculoskeletal: Negative for back pain, joint pain, myalgias  Neurological: Negative for dizziness and headaches   Objective:     Filed Vitals:   10/01/12 1529  BP: 138/80   General:  alert, cooperative and no distress   Breasts:  negative  Lungs: clear to auscultation bilaterally  Heart:  regular rate and rhythm  Abdomen: Soft, nontender   Vulva:  normal  Vagina: normal vagina  Cervix:  closed  Corpus: Well involuted     Rectal Exam: no hemorrhoids        Assessment:    normal postpartum exam.  Plan:    1. Contraception: tubal ligation 2. Follow up in: prn week or as needed.

## 2012-10-14 ENCOUNTER — Encounter: Payer: Self-pay | Admitting: *Deleted

## 2012-10-23 ENCOUNTER — Ambulatory Visit
Admission: RE | Admit: 2012-10-23 | Discharge: 2012-10-23 | Disposition: A | Discharge status: Home / Self Care | Payer: Medicaid Other | Source: Ambulatory Visit | Attending: Family Medicine | Admitting: Family Medicine

## 2012-10-23 ENCOUNTER — Other Ambulatory Visit: Payer: Self-pay | Admitting: Family Medicine

## 2012-10-23 DIAGNOSIS — R52 Pain, unspecified: Secondary | ICD-10-CM

## 2012-11-13 ENCOUNTER — Ambulatory Visit: Payer: Medicaid Other | Admitting: Orthopedic Surgery

## 2012-11-25 ENCOUNTER — Encounter: Payer: Self-pay | Admitting: Orthopedic Surgery

## 2012-11-25 ENCOUNTER — Ambulatory Visit (INDEPENDENT_AMBULATORY_CARE_PROVIDER_SITE_OTHER): Payer: Medicaid Other | Admitting: Orthopedic Surgery

## 2012-11-25 VITALS — BP 129/87 | Ht 66.0 in | Wt 194.0 lb

## 2012-11-25 DIAGNOSIS — M654 Radial styloid tenosynovitis [de Quervain]: Secondary | ICD-10-CM | POA: Insufficient documentation

## 2012-11-25 MED ORDER — DICLOFENAC POTASSIUM 50 MG PO TABS: 50.0000 mg | ORAL_TABLET | Freq: Two times a day (BID) | ORAL | Status: DC

## 2012-11-25 NOTE — Patient Instructions (Signed)
De Quervain's Tenosynovitis  De Quervain's tenosynovitis involves inflammation of one or two tendon linings (sheaths) or strain of one or two tendons to the thumb: extensor pollicis brevis (EPB), or abductor pollicis longus (APL). This causes pain on the side of the wrist and base of the thumb. Tendon sheaths secrete a fluid that lubricates the tendon, allowing the tendon to move smoothly. When the sheath becomes inflamed, the tendon cannot move freely in the sheath. Both the EPB and APL tendons are important for proper use of the hand. The EPB tendon is important for straightening the thumb. The APL tendon is important for moving the thumb away from the index finger (abducting). The two tendons pass through a small tube (canal) in the wrist, near the base of the thumb. When the tendons become inflamed, pain is usually felt in this area.  SYMPTOMS   · Pain, tenderness, swelling, warmth, or redness over the base of the thumb and thumb side of the wrist.  · Pain that gets worse when straightening the thumb.  · Pain that gets worse when moving the thumb away from the index finger, against resistance.  · Pain with pinching or gripping.  · Locking or catching of the thumb.  · Limited motion of the thumb.  · Crackling sound (crepitation) when the tendon or thumb is moved or touched.  · Fluid-filled cyst in the area of the base of the thumb.  CAUSES   · Tenosynovitis is often linked with overuse of the wrist.  · Tenosynovitis may be caused by repeated injury to the thumb muscle and tendon units, and with repeated motions of the hand and wrist, due to friction of the tendon within the lining (sheath).  · Tenosynovitis may also be due to a sudden increase in activity or change in activity.  RISK INCREASES WITH:  · Sports that involve repeated hand and wrist motions (golf, bowling, tennis, squash, racquetball).  · Heavy labor.  · Poor physical wrist strength and flexibility.  · Failure to warm up properly before practice or  play.  · Female gender.  · New mothers who hold their baby's head for long periods or lift infants with thumbs in the infant's armpit (axilla).  PREVENTION  · Warm up and stretch properly before practice or competition.  · Allow enough time for rest and recovery between practices and competition.  · Maintain appropriate conditioning:  · Cardiovascular fitness.  · Forearm, wrist, and hand flexibility.  · Muscle strength and endurance.  · Use proper exercise technique.  PROGNOSIS   This condition is usually curable within 6 weeks, if treated properly with non-surgical treatment and resting of the affected area.   RELATED COMPLICATIONS   · Longer healing time if not properly treated or if not given enough time to heal.  · Chronic inflammation, causing recurring symptoms of tenosynovitis. Permanent pain or restriction of movement.  · Risks of surgery: infection, bleeding, injury to nerves (numbness of the thumb), continued pain, incomplete release of the tendon sheath, recurring symptoms, cutting of the tendons, tendons sliding out of position, weakness of the thumb, thumb stiffness.  TREATMENT   First, treatment involves the use of medicine and ice, to reduce pain and inflammation. Patients are encouraged to stop or modify activities that aggravate the injury. Stretching and strengthening exercises may be advised. Exercises may be completed at home or with a therapist. You may be fitted with a brace or splint, to limit motion and allow the injury to heal. Your caregiver   may also choose to give you a corticosteroid injection, to reduce the pain and inflammation. If non-surgical treatment is not successful, surgery may be needed. Most tenosynovitis surgeries are done as outpatient procedures (you go home the same day). Surgery may involve local, regional (whole arm), or general anesthesia.   MEDICATION   · If pain medicine is needed, nonsteroidal anti-inflammatory medicines (aspirin and ibuprofen), or other minor pain  relievers (acetaminophen), are often advised.  · Do not take pain medicine for 7 days before surgery.  · Prescription pain relievers are often prescribed only after surgery. Use only as directed and only as much as you need.  · Corticosteroid injections may be given if your caregiver thinks they are needed. There is a limited number of times these injections may be given.  COLD THERAPY   · Cold treatment (icing) should be applied for 10 to 15 minutes every 2 to 3 hours for inflammation and pain, and immediately after activity that aggravates your symptoms. Use ice packs or an ice massage.  SEEK MEDICAL CARE IF:   · Symptoms get worse or do not improve in 2 to 4 weeks, despite treatment.  · You experience pain, numbness, or coldness in the hand.  · Blue, gray, or dark color appears in the fingernails.  · Any of the following occur after surgery: increased pain, swelling, redness, drainage of fluids, bleeding in the affected area, or signs of infection.  · New, unexplained symptoms develop. (Drugs used in treatment may produce side effects.)  Document Released: 01/29/2005 Document Revised: 04/23/2011 Document Reviewed: 05/13/2008  [employer]® Patient Information ©2014 [employer].

## 2012-11-25 NOTE — Progress Notes (Signed)
  Subjective:    Patient ID: Bonnie Jones, female    DOB: 1980/10/30, 32 y.o.   MRN: [id number]  HPI Comments: Radial sided right wrist pain over the 1 st compartment   Wrist Pain  The pain is present in the right wrist and right hand. This is a chronic problem. The current episode started more than 1 year ago. There has been no history of extremity trauma. The problem has been unchanged. The pain is at a severity of 8/10. Associated symptoms include joint locking and a limited range of motion. Pertinent negatives include no fever, itching, numbness or tingling.      Review of Systems  Constitutional: Negative for fever.  Respiratory:       Snoring   Skin: Negative for itching.  Neurological: Negative for tingling and numbness.  All other systems reviewed and are negative.       Objective:   Physical Exam  Vital signs: Blood pressure 129/87, height 5\' 6"  (1.676 m), weight 194 lb (87.998 kg).   General the patient is well-developed and well-nourished grooming and hygiene are normal Oriented x3 Mood and affect normal Ambulation normal  Inspection of the right wrist   Painful Full range of motion, with increased pain in ulnar deviation + finkelseins sign  All joints are stable Motor exam is normal Skin clean dry and intact  Cardiovascular exam is normal Sensory exam normal  xrays outside film normal     Assessment & Plan:  Splint  Meds ordered this encounter  Medications  . diclofenac (CATAFLAM) 50 MG tablet    Sig: Take 1 tablet (50 mg total) by mouth 2 (two) times daily.    Dispense:  90 tablet    Refill:  1   F/u in 6 weeks   Encounter Diagnosis  Name Primary?  Tommi Rumps Quervain's syndrome (tenosynovitis) Yes

## 2013-01-15 ENCOUNTER — Ambulatory Visit: Payer: Medicaid Other | Admitting: Orthopedic Surgery

## 2013-01-15 ENCOUNTER — Encounter: Payer: Self-pay | Admitting: Orthopedic Surgery

## 2013-05-15 ENCOUNTER — Emergency Department (HOSPITAL_COMMUNITY): Payer: Self-pay

## 2013-05-15 ENCOUNTER — Encounter (HOSPITAL_COMMUNITY): Payer: Self-pay | Admitting: Emergency Medicine

## 2013-05-15 ENCOUNTER — Emergency Department (HOSPITAL_COMMUNITY)
Admission: EM | Admit: 2013-05-15 | Discharge: 2013-05-15 | Disposition: A | Discharge status: Home / Self Care | Payer: Self-pay | Attending: Emergency Medicine | Admitting: Emergency Medicine

## 2013-05-15 DIAGNOSIS — Z87891 Personal history of nicotine dependence: Secondary | ICD-10-CM | POA: Insufficient documentation

## 2013-05-15 DIAGNOSIS — R630 Anorexia: Secondary | ICD-10-CM | POA: Insufficient documentation

## 2013-05-15 DIAGNOSIS — J45909 Unspecified asthma, uncomplicated: Secondary | ICD-10-CM | POA: Insufficient documentation

## 2013-05-15 DIAGNOSIS — I10 Essential (primary) hypertension: Secondary | ICD-10-CM | POA: Insufficient documentation

## 2013-05-15 DIAGNOSIS — Z3202 Encounter for pregnancy test, result negative: Secondary | ICD-10-CM | POA: Insufficient documentation

## 2013-05-15 DIAGNOSIS — K8020 Calculus of gallbladder without cholecystitis without obstruction: Secondary | ICD-10-CM | POA: Insufficient documentation

## 2013-05-15 DIAGNOSIS — Z872 Personal history of diseases of the skin and subcutaneous tissue: Secondary | ICD-10-CM | POA: Insufficient documentation

## 2013-05-15 LAB — CBC WITH DIFFERENTIAL/PLATELET
BASOS ABS: 0 10*3/uL (ref 0.0–0.1)
Basophils Relative: 0 % (ref 0–1)
Eosinophils Absolute: 0.1 10*3/uL (ref 0.0–0.7)
Eosinophils Relative: 1 % (ref 0–5)
HCT: 37.9 % (ref 36.0–46.0)
HEMOGLOBIN: 12.9 g/dL (ref 12.0–15.0)
LYMPHS PCT: 25 % (ref 12–46)
Lymphs Abs: 1.9 10*3/uL (ref 0.7–4.0)
MCH: 30.3 pg (ref 26.0–34.0)
MCHC: 34 g/dL (ref 30.0–36.0)
MCV: 89 fL (ref 78.0–100.0)
MONOS PCT: 5 % (ref 3–12)
Monocytes Absolute: 0.4 10*3/uL (ref 0.1–1.0)
NEUTROS ABS: 5.2 10*3/uL (ref 1.7–7.7)
Neutrophils Relative %: 69 % (ref 43–77)
Platelets: 221 10*3/uL (ref 150–400)
RBC: 4.26 MIL/uL (ref 3.87–5.11)
RDW: 12.8 % (ref 11.5–15.5)
WBC: 7.5 10*3/uL (ref 4.0–10.5)

## 2013-05-15 LAB — URINALYSIS, ROUTINE W REFLEX MICROSCOPIC
Bilirubin Urine: NEGATIVE
Glucose, UA: NEGATIVE mg/dL
HGB URINE DIPSTICK: NEGATIVE
Ketones, ur: 15 mg/dL — AB
LEUKOCYTES UA: NEGATIVE
Nitrite: NEGATIVE
PROTEIN: NEGATIVE mg/dL
Specific Gravity, Urine: 1.02 (ref 1.005–1.030)
Urobilinogen, UA: 0.2 mg/dL (ref 0.0–1.0)
pH: 7 (ref 5.0–8.0)

## 2013-05-15 LAB — COMPREHENSIVE METABOLIC PANEL
ALT: 30 U/L (ref 0–35)
AST: 30 U/L (ref 0–37)
Albumin: 4 g/dL (ref 3.5–5.2)
Alkaline Phosphatase: 86 U/L (ref 39–117)
BILIRUBIN TOTAL: 0.3 mg/dL (ref 0.3–1.2)
BUN: 12 mg/dL (ref 6–23)
CO2: 24 meq/L (ref 19–32)
CREATININE: 0.68 mg/dL (ref 0.50–1.10)
Calcium: 8.9 mg/dL (ref 8.4–10.5)
Chloride: 103 mEq/L (ref 96–112)
GFR calc Af Amer: >90 mL/min (ref >90)
Glucose, Bld: 85 mg/dL (ref 70–99)
POTASSIUM: 3.9 meq/L (ref 3.7–5.3)
Sodium: 139 mEq/L (ref 137–147)
Total Protein: 7.8 g/dL (ref 6.0–8.3)

## 2013-05-15 LAB — PREGNANCY, URINE: Preg Test, Ur: NEGATIVE

## 2013-05-15 LAB — LIPASE, BLOOD: Lipase: 31 U/L (ref 11–59)

## 2013-05-15 MED ORDER — ONDANSETRON 8 MG PO TBDP
8.0000 mg | ORAL_TABLET | Freq: Once | ORAL | Status: AC
  Administered 2013-05-15: 8 mg via ORAL
  Filled 2013-05-15: qty 1

## 2013-05-15 MED ORDER — HYDROCODONE-ACETAMINOPHEN 5-325 MG PO TABS: 1.0000 | ORAL_TABLET | ORAL | Status: DC | PRN

## 2013-05-15 MED ORDER — PROMETHAZINE HCL 25 MG PO TABS: 25.0000 mg | ORAL_TABLET | Freq: Four times a day (QID) | ORAL | Status: DC | PRN

## 2013-05-15 NOTE — ED Provider Notes (Signed)
Medical screening examination/treatment/procedure(s) were performed by non-physician practitioner and as supervising physician I was immediately available for consultation/collaboration.   EKG Interpretation None        Tanna Furry, MD 05/15/13 [phone]

## 2013-05-15 NOTE — ED Provider Notes (Signed)
CSN: [id number]     Arrival date & time 05/15/13  1132 History   First MD Initiated Contact with Patient 05/15/13 1225     Chief Complaint  Patient presents with  . Abdominal Pain     (Consider location/radiation/quality/duration/timing/severity/associated sxs/prior Treatment) Patient is a 33 y.o. female presenting with abdominal pain. The history is provided by the patient.  Abdominal Pain Pain location:  Epigastric Pain quality: aching and sharp   Pain radiates to:  Does not radiate Pain severity:  Severe Onset quality:  Sudden (intermittent episodes over the past week) Timing:  Intermittent Progression:  Unchanged Chronicity:  New Context: awakening from sleep and eating   Context: not alcohol use, not medication withdrawal, not previous surgeries, not recent travel, not sick contacts and not suspicious food intake   Context comment:  She has been dieting for the past month Relieved by:  Nothing Worsened by:  Eating Ineffective treatments:  Acetaminophen Associated symptoms: anorexia, nausea and vomiting   Associated symptoms: no chest pain, no chills, no constipation, no cough, no diarrhea, no dysuria, no fever, no hematemesis, no hematuria, no shortness of breath, no sore throat and no vaginal discharge   Associated symptoms comment:  She had acid reflux while pregnant with her baby,  Much improved since giving birth 8 months ago. Risk factors: no alcohol abuse     Past Medical History  Diagnosis Date  . Hypertension   . Weight decrease   . Asthma     as child  . GERD (gastroesophageal reflux disease)   . Pregnant   . Abnormal Pap smear   . Skin infection 09/16/2012   Past Surgical History  Procedure Laterality Date  . Tonsillectomy and adenoidectomy    . Mass excision  02/02/2011    Procedure: EXCISION MASS;  Surgeon: Belva Crome, MD;  Location: [facility];  Service: General;  Laterality: Left;  Excision of LEFT UPPER BACK MASS  . Back surgery    . Tonsillectomy     . Tubal ligation N/A 08/23/2012    Procedure: POST PARTUM TUBAL LIGATION with filshie clips and removal of nevi;  Surgeon: Donnamae Jude, MD;  Location: [facility];  Service: Gynecology;  Laterality: N/A;   Family History  Problem Relation Age of Onset  . Hypertension Mother   . Heart disease Mother   . Diabetes Mother   . Cancer Mother   . Other Son     sickle cell gene   History  Substance Use Topics  . Smoking status: Former Smoker -- 1.00 packs/day for 10 years    Types: Cigarettes  . Smokeless tobacco: Never Used  . Alcohol Use: 0.0 oz/week     Comment: beer occ   OB History   Grav Para Term Preterm Abortions TAB SAB Ect Mult Living   3 2 2  1 1    2      Review of Systems  Constitutional: Negative for fever and chills.  HENT: Negative for congestion and sore throat.   Eyes: Negative.   Respiratory: Negative for cough, chest tightness and shortness of breath.   Cardiovascular: Negative for chest pain.  Gastrointestinal: Positive for nausea, vomiting, abdominal pain and anorexia. Negative for diarrhea, constipation and hematemesis.  Genitourinary: Negative.  Negative for dysuria, hematuria and vaginal discharge.  Musculoskeletal: Negative for arthralgias, joint swelling and neck pain.  Skin: Negative.  Negative for rash and wound.  Neurological: Negative for dizziness, weakness, light-headedness, numbness and headaches.  Psychiatric/Behavioral: Negative.  Allergies  Review of patient's allergies indicates no known allergies.  Home Medications   Current Outpatient Rx  Name  Route  Sig  Dispense  Refill  . HYDROcodone-acetaminophen (NORCO/VICODIN) 5-325 MG per tablet   Oral   Take 1 tablet by mouth every 4 (four) hours as needed.   20 tablet   0   . promethazine (PHENERGAN) 25 MG tablet   Oral   Take 1 tablet (25 mg total) by mouth every 6 (six) hours as needed for nausea or vomiting.   12 tablet   0    BP 130/76  Pulse 66  Temp(Src) 98.2 F (36.8 C)  (Oral)  Resp 18  Ht 5\' 6"  (1.676 m)  Wt 191 lb (86.637 kg)  BMI 30.84 kg/m2  SpO2 100%  LMP 05/08/2013 Physical Exam  Nursing note and vitals reviewed. Constitutional: She appears well-developed and well-nourished.  HENT:  Head: Normocephalic and atraumatic.  Eyes: Conjunctivae are normal.  Neck: Normal range of motion.  Cardiovascular: Normal rate, regular rhythm, normal heart sounds and intact distal pulses.   Pulmonary/Chest: Effort normal and breath sounds normal. She has no wheezes.  Abdominal: Soft. Bowel sounds are normal. She exhibits no distension. There is tenderness in the right upper quadrant and epigastric area. There is no rebound, no guarding, no CVA tenderness and negative Murphy's sign.  Musculoskeletal: Normal range of motion.  Neurological: She is alert.  Skin: Skin is warm and dry.  Psychiatric: She has a normal mood and affect.    ED Course  Procedures (including critical care time) Labs Review Labs Reviewed  URINALYSIS, ROUTINE W REFLEX MICROSCOPIC - Abnormal; Notable for the following:    Ketones, ur 15 (*)    All other components within normal limits  CBC WITH DIFFERENTIAL  COMPREHENSIVE METABOLIC PANEL  LIPASE, BLOOD  PREGNANCY, URINE   Imaging Review US Abdomen Complete  05/15/2013   CLINICAL DATA:  Upper abdominal pain  EXAM: ULTRASOUND ABDOMEN COMPLETE  COMPARISON:  04/27/2008  FINDINGS: Gallbladder:  Multiple stones are noted. No wall thickening or pericholecystic fluid is noted. A negative sonographic Percell Miller sign is elicited.  Common bile duct:  Diameter: 4.5 mm  Liver:  No focal lesion identified. Within normal limits in parenchymal echogenicity.  IVC:  No abnormality visualized.  Pancreas:  Visualized portion unremarkable.  Spleen:  Size and appearance within normal limits.  Right Kidney:  Length: 10.9 cm. Echogenicity within normal limits. No mass or hydronephrosis visualized.  Left Kidney:  Length: 10.9 cm. Echogenicity within normal limits. No  mass or hydronephrosis visualized.  Abdominal aorta:  No aneurysm visualized.  Other findings:  None.  IMPRESSION: Cholelithiasis without complicating factors.   Electronically Signed   By: Inez Catalina M.D.   On: 05/15/2013 13:44     EKG Interpretation None      MDM   Final diagnoses:  Gallstones    Suspect biliary colic.  Pt was prescribed hydrocodone and phenergan.  Encouraged to maintain low fat diet.  Referral to general surgery.  Advised recheck here for any fevers, uncontrolled vomiting or pain.  Patients labs and/or radiological studies were viewed and considered during the medical decision making and disposition process.  The patient appears reasonably screened and/or stabilized for discharge and I doubt any other medical condition or other [facility] requiring further screening, evaluation, or treatment in the ED at this time prior to discharge.   Pt discussed with Dr. Jeneen Rinks prior to dc home.    Evalee Jefferson, PA-C 05/15/13 1452

## 2013-05-15 NOTE — ED Notes (Signed)
Pt co intermittent abdominal pain x1 day, N/V denies diarrhea.

## 2013-05-15 NOTE — Discharge Instructions (Signed)
Biliary Colic  °Biliary colic is a steady or irregular pain in the upper abdomen. It is usually under the right side of the rib cage. It happens when gallstones interfere with the normal flow of bile from the gallbladder. Bile is a liquid that helps to digest fats. Bile is made in the liver and stored in the gallbladder. When you eat a meal, bile passes from the gallbladder through the cystic duct and the common bile duct into the small intestine. There, it mixes with partially digested food. If a gallstone blocks either of these ducts, the normal flow of bile is blocked. The muscle cells in the bile duct contract forcefully to try to move the stone. This causes the pain of biliary colic.  °SYMPTOMS  °· A person with biliary colic usually complains of pain in the upper abdomen. This pain can be: °· In the center of the upper abdomen just below the breastbone. °· In the upper-right part of the abdomen, near the gallbladder and liver. °· Spread back toward the right shoulder blade. °· Nausea and vomiting. °· The pain usually occurs after eating. °· Biliary colic is usually triggered by the digestive system's demand for bile. The demand for bile is high after fatty meals. Symptoms can also occur when a person who has been fasting suddenly eats a very large meal. Most episodes of biliary colic pass after 1 to 5 hours. After the most intense pain passes, your abdomen may continue to ache mildly for about 24 hours. °DIAGNOSIS  °After you describe your symptoms, your caregiver will perform a physical exam. He or she will pay attention to the upper right portion of your belly (abdomen). This is the area of your liver and gallbladder. An ultrasound will help your caregiver look for gallstones. Specialized scans of the gallbladder may also be done. Blood tests may be done, especially if you have fever or if your pain persists. °PREVENTION  °Biliary colic can be prevented by controlling the risk factors for gallstones. Some of  these risk factors, such as heredity, increasing age, and pregnancy are a normal part of life. Obesity and a high-fat diet are risk factors you can change through a healthy lifestyle. Women going through menopause who take hormone replacement therapy (estrogen) are also more likely to develop biliary colic. °TREATMENT  °· Pain medication may be prescribed. °· You may be encouraged to eat a fat-free diet. °· If the first episode of biliary colic is severe, or episodes of colic keep retuning, surgery to remove the gallbladder (cholecystectomy) is usually recommended. This procedure can be done through small incisions using an instrument called a laparoscope. The procedure often requires a brief stay in the hospital. Some people can leave the hospital the same day. It is the most widely used treatment in people troubled by painful gallstones. It is effective and safe, with no complications in more than 90% of cases. °· If surgery cannot be done, medication that dissolves gallstones may be used. This medication is expensive and can take months or years to work. Only small stones will dissolve. °· Rarely, medication to dissolve gallstones is combined with a procedure called shock-wave lithotripsy. This procedure uses carefully aimed shock waves to break up gallstones. In many people treated with this procedure, gallstones form again within a few years. °PROGNOSIS  °If gallstones block your cystic duct or common bile duct, you are at risk for repeated episodes of biliary colic. There is also a 25% chance that you will develop   a gallbladder infection(acute cholecystitis), or some other complication of gallstones within 10 to 20 years. If you have surgery, schedule it at a time that is convenient for you and at a time when you are not sick. HOME CARE INSTRUCTIONS   Drink plenty of clear fluids.  Avoid fatty, greasy or fried foods, or any foods that make your pain worse.  Take medications as directed. SEEK MEDICAL  CARE IF:   You develop a fever over 100.5 F (38.1 C).  Your pain gets worse over time.  You develop nausea that prevents you from eating and drinking.  You develop vomiting. SEEK IMMEDIATE MEDICAL CARE IF:   You have continuous or severe belly (abdominal) pain which is not relieved with medications.  You develop nausea and vomiting which is not relieved with medications.  You have symptoms of biliary colic and you suddenly develop a fever and shaking chills. This may signal cholecystitis. Call your caregiver immediately.  You develop a yellow color to your skin or the white part of your eyes (jaundice).  You develop fever. Document Released: 07/02/2005 Document Revised: 04/23/2011 Document Reviewed: 09/11/2007 [facility] Patient Information 2014 [place].   Do not drive within 4 hours of taking hydrocodone as this medicine will make you drowsy.

## 2013-07-25 ENCOUNTER — Emergency Department (HOSPITAL_COMMUNITY)
Admission: EM | Admit: 2013-07-25 | Discharge: 2013-07-25 | Disposition: A | Discharge status: Home / Self Care | Payer: Self-pay | Attending: Emergency Medicine | Admitting: Emergency Medicine

## 2013-07-25 ENCOUNTER — Encounter (HOSPITAL_COMMUNITY): Payer: Self-pay | Admitting: Emergency Medicine

## 2013-07-25 DIAGNOSIS — Z8719 Personal history of other diseases of the digestive system: Secondary | ICD-10-CM | POA: Insufficient documentation

## 2013-07-25 DIAGNOSIS — M7750 Other enthesopathy of unspecified foot: Secondary | ICD-10-CM

## 2013-07-25 DIAGNOSIS — Z87891 Personal history of nicotine dependence: Secondary | ICD-10-CM | POA: Insufficient documentation

## 2013-07-25 DIAGNOSIS — M659 Synovitis and tenosynovitis, unspecified: Secondary | ICD-10-CM | POA: Insufficient documentation

## 2013-07-25 DIAGNOSIS — M65979 Unspecified synovitis and tenosynovitis, unspecified ankle and foot: Secondary | ICD-10-CM | POA: Insufficient documentation

## 2013-07-25 DIAGNOSIS — I10 Essential (primary) hypertension: Secondary | ICD-10-CM | POA: Insufficient documentation

## 2013-07-25 DIAGNOSIS — J45909 Unspecified asthma, uncomplicated: Secondary | ICD-10-CM | POA: Insufficient documentation

## 2013-07-25 DIAGNOSIS — Z872 Personal history of diseases of the skin and subcutaneous tissue: Secondary | ICD-10-CM | POA: Insufficient documentation

## 2013-07-25 DIAGNOSIS — M79609 Pain in unspecified limb: Secondary | ICD-10-CM | POA: Insufficient documentation

## 2013-07-25 DIAGNOSIS — M79673 Pain in unspecified foot: Secondary | ICD-10-CM

## 2013-07-25 MED ORDER — DICLOFENAC SODIUM 75 MG PO TBEC: 75.0000 mg | DELAYED_RELEASE_TABLET | Freq: Two times a day (BID) | ORAL | Status: DC

## 2013-07-25 MED ORDER — DEXAMETHASONE 4 MG PO TABS: ORAL_TABLET | ORAL | Status: DC

## 2013-07-25 NOTE — ED Provider Notes (Signed)
Medical screening examination/treatment/procedure(s) were performed by non-physician practitioner and as supervising physician I was immediately available for consultation/collaboration.   EKG Interpretation None        Sharyon Cable, MD 07/25/13 1642

## 2013-07-25 NOTE — ED Provider Notes (Signed)
CSN: [id number]     Arrival date & time 07/25/13  1413 History   First MD Initiated Contact with Patient 07/25/13 1505     Chief Complaint  Patient presents with  . Foot Pain     (Consider location/radiation/quality/duration/timing/severity/associated sxs/prior Treatment) Patient is a 33 y.o. female presenting with lower extremity pain. The history is provided by the patient.  Foot Pain This is a new problem. The current episode started 1 to 4 weeks ago. The problem occurs intermittently. The problem has been gradually worsening. Pertinent negatives include no abdominal pain, arthralgias, chest pain, coughing, neck pain, numbness or weakness. The symptoms are aggravated by standing and walking. Treatments tried: soaking in warm water. The treatment provided moderate relief.    Past Medical History  Diagnosis Date  . Hypertension   . Weight decrease   . Asthma     as child  . GERD (gastroesophageal reflux disease)   . Pregnant   . Abnormal Pap smear   . Skin infection 09/16/2012   Past Surgical History  Procedure Laterality Date  . Tonsillectomy and adenoidectomy    . Mass excision  02/02/2011    Procedure: EXCISION MASS;  Surgeon: Belva Crome, MD;  Location: [facility];  Service: General;  Laterality: Left;  Excision of LEFT UPPER BACK MASS  . Back surgery    . Tonsillectomy    . Tubal ligation N/A 08/23/2012    Procedure: POST PARTUM TUBAL LIGATION with filshie clips and removal of nevi;  Surgeon: Donnamae Jude, MD;  Location: [facility];  Service: Gynecology;  Laterality: N/A;   Family History  Problem Relation Age of Onset  . Hypertension Mother   . Heart disease Mother   . Diabetes Mother   . Cancer Mother   . Other Son     sickle cell gene   History  Substance Use Topics  . Smoking status: Former Smoker -- 1.00 packs/day for 10 years    Types: Cigarettes  . Smokeless tobacco: Never Used  . Alcohol Use: 0.0 oz/week     Comment: beer occ   OB History   Grav Para  Term Preterm Abortions TAB SAB Ect Mult Living   3 2 2  1 1    2      Review of Systems  Constitutional: Negative for activity change.       All ROS Neg except as noted in HPI  HENT: Negative for nosebleeds.   Eyes: Negative for photophobia and discharge.  Respiratory: Negative for cough, shortness of breath and wheezing.   Cardiovascular: Negative for chest pain and palpitations.  Gastrointestinal: Negative for abdominal pain and blood in stool.  Genitourinary: Negative for dysuria, frequency and hematuria.  Musculoskeletal: Negative for arthralgias, back pain and neck pain.       Foot pain  Skin: Negative.   Neurological: Negative for dizziness, seizures, speech difficulty, weakness and numbness.  Psychiatric/Behavioral: Negative for hallucinations and confusion.      Allergies  Review of patient's allergies indicates no known allergies.  Home Medications   Prior to Admission medications   Not on File   BP 148/80  Pulse 75  Temp(Src) 98.1 F (36.7 C) (Oral)  Resp 18  Ht 5\' 6"  (1.676 m)  Wt 180 lb (81.647 kg)  BMI 29.07 kg/m2  SpO2 100%  LMP 07/19/2013 Physical Exam  Nursing note and vitals reviewed. Constitutional: She is oriented to person, place, and time. She appears well-developed and well-nourished.  Non-toxic appearance.  HENT:  Head: Normocephalic.  Right Ear: Tympanic membrane and external ear normal.  Left Ear: Tympanic membrane and external ear normal.  Eyes: EOM and lids are normal. Pupils are equal, round, and reactive to light.  Neck: Normal range of motion. Neck supple. Carotid bruit is not present.  Cardiovascular: Normal rate, regular rhythm, normal heart sounds, intact distal pulses and normal pulses.   Pulmonary/Chest: Breath sounds normal. No respiratory distress.  Abdominal: Soft. Bowel sounds are normal. There is no tenderness. There is no guarding.  Musculoskeletal: Normal range of motion.  There is mild-to-moderate swelling of the dorsum of  the left foot. There is soreness with flexing of the second toe extending into the dorsum of the left foot. There no red streaks appreciated. The area is not hot. There no lesions between the toes. There no puncture wounds of the plantar surface. The dorsalis pedis and posterior tibial pulses are 2+ bilaterally. The Achilles tendon is intact. There is no deformity of the tibial area.  Lymphadenopathy:       Head (right side): No submandibular adenopathy present.       Head (left side): No submandibular adenopathy present.    She has no cervical adenopathy.  Neurological: She is alert and oriented to person, place, and time. She has normal strength. No cranial nerve deficit or sensory deficit.  Skin: Skin is warm and dry.  Psychiatric: She has a normal mood and affect. Her speech is normal.    ED Course  Procedures (including critical care time) Labs Review Labs Reviewed - No data to display  Imaging Review No results found.   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 She also changes the height of her shoes from heels to wedge is to flax frequently. His been no known rakes in the skin. Patient has not had any operations or procedures involving the left foot. Suspect the patient has a tendinitis of the left foot.  The plan at this time is for the patient to elevate the the above the waist is much as possible. Patient will be treated with  Diclofenac and Decadron. Patient advised to see her primary physician or return to the emergency apartment if any changes, problems, or concerns.    Final diagnoses:  Foot pain    **I have reviewed nursing notes, vital signs, and all appropriate lab and imaging results for this patient.Lenox Ahr, PA-C 07/25/13 [phone]

## 2013-07-25 NOTE — ED Notes (Signed)
Left foot swollen with pain times 2 weeks.  Denies any injury

## 2013-07-25 NOTE — Discharge Instructions (Signed)
I suspect you have a tendinitis involving your foot. Please elevate her foot is much as possible above your waist. Please use diclofenac and Decadron 2 times daily with food until all taken. Please see Dr. Hassell Done or return to the emergency department if any signs of infection, or deterioration in your condition. Tendinitis Tendinitis is swelling and inflammation of the tendons. Tendons are band-like tissues that connect muscle to bone. Tendinitis commonly occurs in the:   Shoulders (rotator cuff).  Heels (Achilles tendon).  Elbows (triceps tendon). CAUSES Tendinitis is usually caused by overusing the tendon, muscles, and joints involved. When the tissue surrounding a tendon (synovium) becomes inflamed, it is called tenosynovitis. Tendinitis commonly develops in people whose jobs require repetitive motions. SYMPTOMS  Pain.  Tenderness.  Mild swelling. DIAGNOSIS Tendinitis is usually diagnosed by physical exam. Your caregiver may also order X-rays or other imaging tests. TREATMENT Your caregiver may recommend certain medicines or exercises for your treatment. HOME CARE INSTRUCTIONS   Use a sling or splint for as long as directed by your caregiver until the pain decreases.  Put ice on the injured area.  Put ice in a plastic bag.  Place a towel between your skin and the bag.  Leave the ice on for 15-20 minutes, 03-04 times a day.  Avoid using the limb while the tendon is painful. Perform gentle range of motion exercises only as directed by your caregiver. Stop exercises if pain or discomfort increase, unless directed otherwise by your caregiver.  Only take over-the-counter or prescription medicines for pain, discomfort, or fever as directed by your caregiver. SEEK MEDICAL CARE IF:   Your pain and swelling increase.  You develop new, unexplained symptoms, especially increased numbness in the hands. MAKE SURE YOU:   Understand these instructions.  Will watch your  condition.  Will get help right away if you are not doing well or get worse. Document Released: 01/27/2000 Document Revised: 04/23/2011 Document Reviewed: 04/17/2010 [facility] Patient Information 2014 [place], [place].

## 2013-07-25 NOTE — ED Notes (Signed)
Patient with no complaints at this time. Respirations even and unlabored. Skin warm/dry. Discharge instructions reviewed with patient at this time. Patient given opportunity to voice concerns/ask questions. Patient discharged at this time and left Emergency Department with steady gait.   

## 2013-07-30 ENCOUNTER — Encounter (HOSPITAL_COMMUNITY): Payer: Self-pay | Admitting: Emergency Medicine

## 2013-07-30 ENCOUNTER — Emergency Department (HOSPITAL_COMMUNITY)
Admission: EM | Admit: 2013-07-30 | Discharge: 2013-07-30 | Disposition: A | Discharge status: Home / Self Care | Payer: Self-pay | Attending: Emergency Medicine | Admitting: Emergency Medicine

## 2013-07-30 DIAGNOSIS — K8050 Calculus of bile duct without cholangitis or cholecystitis without obstruction: Secondary | ICD-10-CM

## 2013-07-30 DIAGNOSIS — Z872 Personal history of diseases of the skin and subcutaneous tissue: Secondary | ICD-10-CM | POA: Insufficient documentation

## 2013-07-30 DIAGNOSIS — Z9851 Tubal ligation status: Secondary | ICD-10-CM | POA: Insufficient documentation

## 2013-07-30 DIAGNOSIS — F17200 Nicotine dependence, unspecified, uncomplicated: Secondary | ICD-10-CM | POA: Insufficient documentation

## 2013-07-30 DIAGNOSIS — J45909 Unspecified asthma, uncomplicated: Secondary | ICD-10-CM | POA: Insufficient documentation

## 2013-07-30 DIAGNOSIS — K8020 Calculus of gallbladder without cholecystitis without obstruction: Secondary | ICD-10-CM | POA: Insufficient documentation

## 2013-07-30 DIAGNOSIS — Z791 Long term (current) use of non-steroidal anti-inflammatories (NSAID): Secondary | ICD-10-CM | POA: Insufficient documentation

## 2013-07-30 DIAGNOSIS — I10 Essential (primary) hypertension: Secondary | ICD-10-CM | POA: Insufficient documentation

## 2013-07-30 DIAGNOSIS — IMO0002 Reserved for concepts with insufficient information to code with codable children: Secondary | ICD-10-CM | POA: Insufficient documentation

## 2013-07-30 LAB — LIPASE, BLOOD: Lipase: 43 U/L (ref 11–59)

## 2013-07-30 LAB — CBC WITH DIFFERENTIAL/PLATELET
BASOS ABS: 0 10*3/uL (ref 0.0–0.1)
BASOS PCT: 0 % (ref 0–1)
Eosinophils Absolute: 0.2 10*3/uL (ref 0.0–0.7)
Eosinophils Relative: 2 % (ref 0–5)
HCT: 37.6 % (ref 36.0–46.0)
Hemoglobin: 13.1 g/dL (ref 12.0–15.0)
LYMPHS PCT: 36 % (ref 12–46)
Lymphs Abs: 3.4 10*3/uL (ref 0.7–4.0)
MCH: 30.4 pg (ref 26.0–34.0)
MCHC: 34.8 g/dL (ref 30.0–36.0)
MCV: 87.2 fL (ref 78.0–100.0)
Monocytes Absolute: 0.6 10*3/uL (ref 0.1–1.0)
Monocytes Relative: 7 % (ref 3–12)
NEUTROS ABS: 5.3 10*3/uL (ref 1.7–7.7)
Neutrophils Relative %: 55 % (ref 43–77)
PLATELETS: 258 10*3/uL (ref 150–400)
RBC: 4.31 MIL/uL (ref 3.87–5.11)
RDW: 12.7 % (ref 11.5–15.5)
WBC: 9.4 10*3/uL (ref 4.0–10.5)

## 2013-07-30 LAB — COMPREHENSIVE METABOLIC PANEL WITH GFR
ALT: 33 U/L (ref 0–35)
AST: 45 U/L — ABNORMAL HIGH (ref 0–37)
Albumin: 3.8 g/dL (ref 3.5–5.2)
Alkaline Phosphatase: 94 U/L (ref 39–117)
BUN: 12 mg/dL (ref 6–23)
CO2: 25 meq/L (ref 19–32)
Calcium: 8.7 mg/dL (ref 8.4–10.5)
Chloride: 100 meq/L (ref 96–112)
Creatinine, Ser: 0.68 mg/dL (ref 0.50–1.10)
GFR calc Af Amer: >90 mL/min
GFR calc non Af Amer: >90 mL/min
Glucose, Bld: 94 mg/dL (ref 70–99)
Potassium: 3.9 meq/L (ref 3.7–5.3)
Sodium: 139 meq/L (ref 137–147)
Total Bilirubin: 0.2 mg/dL — ABNORMAL LOW (ref 0.3–1.2)
Total Protein: 7.4 g/dL (ref 6.0–8.3)

## 2013-07-30 MED ORDER — HYDROCODONE-ACETAMINOPHEN 5-325 MG PO TABS: 2.0000 | ORAL_TABLET | ORAL | Status: DC | PRN

## 2013-07-30 MED ORDER — MORPHINE SULFATE 4 MG/ML IJ SOLN
6.0000 mg | Freq: Once | INTRAMUSCULAR | Status: AC
  Administered 2013-07-30: 6 mg via INTRAVENOUS
  Filled 2013-07-30: qty 2

## 2013-07-30 MED ORDER — ONDANSETRON 4 MG PO TBDP: ORAL_TABLET | ORAL | Status: DC

## 2013-07-30 NOTE — ED Notes (Signed)
Patient given discharge instruction, verbalized understand. IV removed, band aid applied. Patient ambulatory out of the department.  

## 2013-07-30 NOTE — ED Notes (Signed)
Pt c/o upper rt abd pain since 0100

## 2013-07-30 NOTE — Discharge Instructions (Signed)
If you were given medicines take as directed.  If you are on coumadin or contraceptives realize their levels and effectiveness is altered by many different medicines.  If you have any reaction (rash, tongues swelling, other) to the medicines stop taking and see a physician.   Please follow up as directed and return to the ER or see a physician for new or worsening symptoms.  Thank you. Filed Vitals:   07/30/13 0242  BP: 131/81  Pulse: 71  Temp: 97.7 F (36.5 C)  TempSrc: Oral  Resp: 16  Height: 5' 5.5" (1.664 m)  Weight: 180 lb (81.647 kg)  SpO2: 37%    Biliary Colic  Biliary colic is a steady or irregular pain in the upper abdomen. It is usually under the right side of the rib cage. It happens when gallstones interfere with the normal flow of bile from the gallbladder. Bile is a liquid that helps to digest fats. Bile is made in the liver and stored in the gallbladder. When you eat a meal, bile passes from the gallbladder through the cystic duct and the common bile duct into the small intestine. There, it mixes with partially digested food. If a gallstone blocks either of these ducts, the normal flow of bile is blocked. The muscle cells in the bile duct contract forcefully to try to move the stone. This causes the pain of biliary colic.  SYMPTOMS   A person with biliary colic usually complains of pain in the upper abdomen. This pain can be:  In the center of the upper abdomen just below the breastbone.  In the upper-right part of the abdomen, near the gallbladder and liver.  Spread back toward the right shoulder blade.  Nausea and vomiting.  The pain usually occurs after eating.  Biliary colic is usually triggered by the digestive system's demand for bile. The demand for bile is high after fatty meals. Symptoms can also occur when a person who has been fasting suddenly eats a very large meal. Most episodes of biliary colic pass after 1 to 5 hours. After the most intense pain passes,  your abdomen may continue to ache mildly for about 24 hours. DIAGNOSIS  After you describe your symptoms, your caregiver will perform a physical exam. He or she will pay attention to the upper right portion of your belly (abdomen). This is the area of your liver and gallbladder. An ultrasound will help your caregiver look for gallstones. Specialized scans of the gallbladder may also be done. Blood tests may be done, especially if you have fever or if your pain persists. PREVENTION  Biliary colic can be prevented by controlling the risk factors for gallstones. Some of these risk factors, such as heredity, increasing age, and pregnancy are a normal part of life. Obesity and a high-fat diet are risk factors you can change through a healthy lifestyle. Women going through menopause who take hormone replacement therapy (estrogen) are also more likely to develop biliary colic. TREATMENT   Pain medication may be prescribed.  You may be encouraged to eat a fat-free diet.  If the first episode of biliary colic is severe, or episodes of colic keep retuning, surgery to remove the gallbladder (cholecystectomy) is usually recommended. This procedure can be done through small incisions using an instrument called a laparoscope. The procedure often requires a brief stay in the hospital. Some people can leave the hospital the same day. It is the most widely used treatment in people troubled by painful gallstones. It is effective  and safe, with no complications in more than 90% of cases.  If surgery cannot be done, medication that dissolves gallstones may be used. This medication is expensive and can take months or years to work. Only small stones will dissolve.  Rarely, medication to dissolve gallstones is combined with a procedure called shock-wave lithotripsy. This procedure uses carefully aimed shock waves to break up gallstones. In many people treated with this procedure, gallstones form again within a few  years. PROGNOSIS  If gallstones block your cystic duct or common bile duct, you are at risk for repeated episodes of biliary colic. There is also a 25% chance that you will develop a gallbladder infection(acute cholecystitis), or some other complication of gallstones within 10 to 20 years. If you have surgery, schedule it at a time that is convenient for you and at a time when you are not sick. HOME CARE INSTRUCTIONS   Drink plenty of clear fluids.  Avoid fatty, greasy or fried foods, or any foods that make your pain worse.  Take medications as directed. SEEK MEDICAL CARE IF:   You develop a fever over 100.5 F (38.1 C).  Your pain gets worse over time.  You develop nausea that prevents you from eating and drinking.  You develop vomiting. SEEK IMMEDIATE MEDICAL CARE IF:   You have continuous or severe belly (abdominal) pain which is not relieved with medications.  You develop nausea and vomiting which is not relieved with medications.  You have symptoms of biliary colic and you suddenly develop a fever and shaking chills. This may signal cholecystitis. Call your caregiver immediately.  You develop a yellow color to your skin or the white part of your eyes (jaundice). Document Released: 07/02/2005 Document Revised: 04/23/2011 Document Reviewed: 09/11/2007 [facility] Patient Information 2015 [place], [place]. This information is not intended to replace advice given to you by your health care provider. Make sure you discuss any questions you have with your health care provider.

## 2013-07-30 NOTE — ED Provider Notes (Signed)
CSN: [id number]     Arrival date & time 07/30/13  0235 History   First MD Initiated Contact with Patient 07/30/13 0248     Chief Complaint  Patient presents with  . Abdominal Pain     (Consider location/radiation/quality/duration/timing/severity/associated sxs/prior Treatment) HPI Comments: 33 year old female with history of high blood pressure, gallstones presents with significant right upper quadrant abdominal pain since 1 AM. This pain is similar to her other episode of biliary colic she has had. No fevers or chills. Mild vomiting. Patient has salad for dinner and packaged food earlier in the day. No significant radiation, pain constant sharp ache. No kidney stone history  Patient is a 33 y.o. female presenting with abdominal pain. The history is provided by the patient.  Abdominal Pain Associated symptoms: nausea and vomiting   Associated symptoms: no chest pain, no chills, no dysuria, no fever and no shortness of breath     Past Medical History  Diagnosis Date  . Hypertension   . Weight decrease   . Asthma     as child  . GERD (gastroesophageal reflux disease)   . Pregnant   . Abnormal Pap smear   . Skin infection 09/16/2012   Past Surgical History  Procedure Laterality Date  . Tonsillectomy and adenoidectomy    . Mass excision  02/02/2011    Procedure: EXCISION MASS;  Surgeon: Belva Crome, MD;  Location: [facility];  Service: General;  Laterality: Left;  Excision of LEFT UPPER BACK MASS  . Back surgery    . Tonsillectomy    . Tubal ligation N/A 08/23/2012    Procedure: POST PARTUM TUBAL LIGATION with filshie clips and removal of nevi;  Surgeon: Donnamae Jude, MD;  Location: [facility];  Service: Gynecology;  Laterality: N/A;   Family History  Problem Relation Age of Onset  . Hypertension Mother   . Heart disease Mother   . Diabetes Mother   . Cancer Mother   . Other Son     sickle cell gene   History  Substance Use Topics  . Smoking status: Current Every Day Smoker  -- 1.00 packs/day for 10 years  . Smokeless tobacco: Never Used  . Alcohol Use: 0.0 oz/week     Comment: beer occ   OB History   Grav Para Term Preterm Abortions TAB SAB Ect Mult Living   3 2 2  1 1    2      Review of Systems  Constitutional: Positive for appetite change. Negative for fever and chills.  HENT: Negative for congestion.   Eyes: Negative for visual disturbance.  Respiratory: Negative for shortness of breath.   Cardiovascular: Negative for chest pain.  Gastrointestinal: Positive for nausea, vomiting and abdominal pain.  Genitourinary: Negative for dysuria and flank pain.  Musculoskeletal: Negative for back pain, neck pain and neck stiffness.  Skin: Negative for rash.  Neurological: Negative for light-headedness and headaches.      Allergies  Review of patient's allergies indicates no known allergies.  Home Medications   Prior to Admission medications   Medication Sig Start Date End Date Taking? Authorizing Provider  dexamethasone (DECADRON) 4 MG tablet 1 po bid with food 07/25/13  Yes Lenox Ahr, PA-C  diclofenac (VOLTAREN) 75 MG EC tablet Take 1 tablet (75 mg total) by mouth 2 (two) times daily. 07/25/13  Yes Lenox Ahr, PA-C   BP 131/81  Pulse 71  Temp(Src) 97.7 F (36.5 C) (Oral)  Resp 16  Ht 5' 5.5" (  1.664 m)  Wt 180 lb (81.647 kg)  BMI 29.49 kg/m2  SpO2 99%  LMP 07/19/2013 Physical Exam  Nursing note and vitals reviewed. Constitutional: She is oriented to person, place, and time. She appears well-developed and well-nourished.  HENT:  Head: Normocephalic and atraumatic.  Mild dry mucous membranes  Eyes: Conjunctivae are normal. Right eye exhibits no discharge. Left eye exhibits no discharge. No scleral icterus.  Neck: Normal range of motion. Neck supple. No tracheal deviation present.  Cardiovascular: Normal rate and regular rhythm.   Pulmonary/Chest: Effort normal and breath sounds normal.  Abdominal: Soft. She exhibits no distension.  There is tenderness (right upper quadrant mild to moderate). There is no guarding.  Musculoskeletal: She exhibits no edema.  Neurological: She is alert and oriented to person, place, and time.  Skin: Skin is warm. No rash noted.  Psychiatric: She has a normal mood and affect.    ED Course  Procedures (including critical care time)  EMERGENCY DEPARTMENT BILIARY ULTRASOUND INTERPRETATION "Study: Limited Abdominal Ultrasound of the gallbladder and common bile duct."  INDICATIONS: RUQ pain, Nausea and Vomiting Indication: Multiple views of the gallbladder and common bile duct were obtained in real-time with a Multi-frequency probe." PERFORMED BY:  Myself IMAGES ARCHIVED?: Yes FINDINGS: Gallstones present, Gallbladder wall normal in thickness, Sonographic Murphy's sign absent and Common bile duct normal in size LIMITATIONS: Body Habitus, Bowel Gas and Abdominal pain INTERPRETATION: Cholelithiasis   Labs Review Labs Reviewed  COMPREHENSIVE METABOLIC PANEL - Abnormal; Notable for the following:    AST 45 (*)    Total Bilirubin 0.2 (*)    All other components within normal limits  LIPASE, BLOOD  CBC WITH DIFFERENTIAL  URINALYSIS, ROUTINE W REFLEX MICROSCOPIC  PREGNANCY, URINE    Imaging Review No results found.   EKG Interpretation None      MDM   Final diagnoses:  Biliary colic  Cholelithiasis   Clinically patient presents with biliary colic. Bedside ultrasound done and no wall thickening or pericholecystic fluid. IV fluids, pain and nausea medicines given. Labs pending and plan for reassessment. Discussed likely close outpatient followup with general surgery to have gallbladder removed.  Patient proves significantly on recheck and pain and vomiting control. Discussed avoiding fatty foods and close followup with general surgery to discuss and likely arrange cholecystectomy. Reasons to return given  Results and differential diagnosis were discussed with the  patient/parent/guardian. Close follow up outpatient was discussed, comfortable with the plan.   Medications  morphine 4 MG/ML injection 6 mg (6 mg Intravenous Given 07/30/13 0322)    Filed Vitals:   07/30/13 0242  BP: 131/81  Pulse: 71  Temp: 97.7 F (36.5 C)  TempSrc: Oral  Resp: 16  Height: 5' 5.5" (1.664 m)  Weight: 180 lb (81.647 kg)  SpO2: 99%        Mariea Clonts, MD 07/30/13 [phone]

## 2013-07-30 NOTE — ED Notes (Signed)
Md at bedside with u/s to assess pt abdomen

## 2013-10-14 ENCOUNTER — Emergency Department (HOSPITAL_COMMUNITY): Payer: 59

## 2013-10-14 ENCOUNTER — Inpatient Hospital Stay (HOSPITAL_COMMUNITY): Payer: 59

## 2013-10-14 ENCOUNTER — Encounter (HOSPITAL_COMMUNITY): Payer: Self-pay | Admitting: Emergency Medicine

## 2013-10-14 ENCOUNTER — Inpatient Hospital Stay (HOSPITAL_COMMUNITY)
Admission: EM | Admit: 2013-10-14 | Discharge: 2013-10-16 | DRG: 419 | Disposition: A | Discharge status: Home / Self Care | Payer: 59 | Attending: General Surgery | Admitting: General Surgery

## 2013-10-14 DIAGNOSIS — R1011 Right upper quadrant pain: Secondary | ICD-10-CM | POA: Diagnosis present

## 2013-10-14 DIAGNOSIS — Z833 Family history of diabetes mellitus: Secondary | ICD-10-CM | POA: Diagnosis not present

## 2013-10-14 DIAGNOSIS — J45909 Unspecified asthma, uncomplicated: Secondary | ICD-10-CM | POA: Diagnosis present

## 2013-10-14 DIAGNOSIS — F17200 Nicotine dependence, unspecified, uncomplicated: Secondary | ICD-10-CM | POA: Diagnosis present

## 2013-10-14 DIAGNOSIS — K8010 Calculus of gallbladder with chronic cholecystitis without obstruction: Principal | ICD-10-CM | POA: Diagnosis present

## 2013-10-14 DIAGNOSIS — I10 Essential (primary) hypertension: Secondary | ICD-10-CM | POA: Diagnosis present

## 2013-10-14 DIAGNOSIS — Z8249 Family history of ischemic heart disease and other diseases of the circulatory system: Secondary | ICD-10-CM | POA: Diagnosis not present

## 2013-10-14 DIAGNOSIS — K8001 Calculus of gallbladder with acute cholecystitis with obstruction: Secondary | ICD-10-CM | POA: Diagnosis present

## 2013-10-14 DIAGNOSIS — K219 Gastro-esophageal reflux disease without esophagitis: Secondary | ICD-10-CM | POA: Diagnosis present

## 2013-10-14 LAB — CBC WITH DIFFERENTIAL/PLATELET
BASOS ABS: 0 10*3/uL (ref 0.0–0.1)
BASOS PCT: 1 % (ref 0–1)
Eosinophils Absolute: 0.1 10*3/uL (ref 0.0–0.7)
Eosinophils Relative: 2 % (ref 0–5)
HEMATOCRIT: 36.8 % (ref 36.0–46.0)
Hemoglobin: 12.8 g/dL (ref 12.0–15.0)
LYMPHS PCT: 23 % (ref 12–46)
Lymphs Abs: 1.5 10*3/uL (ref 0.7–4.0)
MCH: 30.6 pg (ref 26.0–34.0)
MCHC: 34.8 g/dL (ref 30.0–36.0)
MCV: 88 fL (ref 78.0–100.0)
MONO ABS: 0.4 10*3/uL (ref 0.1–1.0)
Monocytes Relative: 6 % (ref 3–12)
NEUTROS ABS: 4.4 10*3/uL (ref 1.7–7.7)
NEUTROS PCT: 68 % (ref 43–77)
Platelets: 215 10*3/uL (ref 150–400)
RBC: 4.18 MIL/uL (ref 3.87–5.11)
RDW: 13.3 % (ref 11.5–15.5)
WBC: 6.4 10*3/uL (ref 4.0–10.5)

## 2013-10-14 LAB — COMPREHENSIVE METABOLIC PANEL
ALT: 602 U/L — ABNORMAL HIGH (ref 0–35)
AST: 319 U/L — ABNORMAL HIGH (ref 0–37)
Albumin: 3.9 g/dL (ref 3.5–5.2)
Alkaline Phosphatase: 244 U/L — ABNORMAL HIGH (ref 39–117)
Anion gap: 13 (ref 5–15)
BILIRUBIN TOTAL: 2.4 mg/dL — AB (ref 0.3–1.2)
BUN: 4 mg/dL — ABNORMAL LOW (ref 6–23)
CHLORIDE: 104 meq/L (ref 96–112)
CO2: 22 meq/L (ref 19–32)
CREATININE: 0.64 mg/dL (ref 0.50–1.10)
Calcium: 8.7 mg/dL (ref 8.4–10.5)
GFR calc Af Amer: >90 mL/min (ref >90)
Glucose, Bld: 91 mg/dL (ref 70–99)
Potassium: 3.7 mEq/L (ref 3.7–5.3)
Sodium: 139 mEq/L (ref 137–147)
Total Protein: 7.6 g/dL (ref 6.0–8.3)

## 2013-10-14 LAB — URINALYSIS, ROUTINE W REFLEX MICROSCOPIC
Glucose, UA: NEGATIVE mg/dL
HGB URINE DIPSTICK: NEGATIVE
KETONES UR: 40 mg/dL — AB
LEUKOCYTES UA: NEGATIVE
Nitrite: NEGATIVE
PH: 5.5 (ref 5.0–8.0)
Specific Gravity, Urine: 1.025 (ref 1.005–1.030)
Urobilinogen, UA: 1 mg/dL (ref 0.0–1.0)

## 2013-10-14 LAB — PREGNANCY, URINE: Preg Test, Ur: NEGATIVE

## 2013-10-14 LAB — SURGICAL PCR SCREEN
MRSA, PCR: NEGATIVE
Staphylococcus aureus: POSITIVE — AB

## 2013-10-14 LAB — HEPATITIS PANEL, ACUTE
HCV AB: NEGATIVE
Hep A IgM: NONREACTIVE
Hep B C IgM: NONREACTIVE
Hepatitis B Surface Ag: NEGATIVE

## 2013-10-14 LAB — URINE MICROSCOPIC-ADD ON

## 2013-10-14 LAB — LIPASE, BLOOD: Lipase: 24 U/L (ref 11–59)

## 2013-10-14 MED ORDER — ACETAMINOPHEN 650 MG RE SUPP: 650.0000 mg | Freq: Four times a day (QID) | RECTAL | Status: DC | PRN

## 2013-10-14 MED ORDER — HYDROMORPHONE HCL PF 1 MG/ML IJ SOLN
1.0000 mg | INTRAMUSCULAR | Status: DC | PRN
  Administered 2013-10-14 – 2013-10-15 (×3): 1 mg via INTRAVENOUS
  Filled 2013-10-14 (×3): qty 1

## 2013-10-14 MED ORDER — DIPHENHYDRAMINE HCL 50 MG/ML IJ SOLN: 12.5000 mg | Freq: Four times a day (QID) | INTRAMUSCULAR | Status: DC | PRN

## 2013-10-14 MED ORDER — KCL IN DEXTROSE-NACL 20-5-0.45 MEQ/L-%-% IV SOLN
INTRAVENOUS | Status: DC
  Administered 2013-10-14 – 2013-10-15 (×3): via INTRAVENOUS

## 2013-10-14 MED ORDER — ONDANSETRON HCL 4 MG/2ML IJ SOLN
4.0000 mg | Freq: Four times a day (QID) | INTRAMUSCULAR | Status: DC | PRN
  Administered 2013-10-15 – 2013-10-16 (×3): 4 mg via INTRAVENOUS
  Filled 2013-10-14 (×3): qty 2

## 2013-10-14 MED ORDER — ACETAMINOPHEN 325 MG PO TABS: 650.0000 mg | ORAL_TABLET | Freq: Four times a day (QID) | ORAL | Status: DC | PRN

## 2013-10-14 MED ORDER — HYDROMORPHONE HCL PF 1 MG/ML IJ SOLN
1.0000 mg | Freq: Once | INTRAMUSCULAR | Status: AC
  Administered 2013-10-14: 1 mg via INTRAVENOUS
  Filled 2013-10-14: qty 1

## 2013-10-14 MED ORDER — ONDANSETRON HCL 4 MG/2ML IJ SOLN
4.0000 mg | Freq: Once | INTRAMUSCULAR | Status: AC
  Administered 2013-10-14: 4 mg via INTRAVENOUS
  Filled 2013-10-14: qty 2

## 2013-10-14 MED ORDER — METOCLOPRAMIDE HCL 5 MG/ML IJ SOLN
10.0000 mg | Freq: Once | INTRAMUSCULAR | Status: AC
  Administered 2013-10-14: 10 mg via INTRAVENOUS
  Filled 2013-10-14: qty 2

## 2013-10-14 MED ORDER — DIPHENHYDRAMINE HCL 12.5 MG/5ML PO ELIX: 12.5000 mg | ORAL_SOLUTION | Freq: Four times a day (QID) | ORAL | Status: DC | PRN

## 2013-10-14 MED ORDER — OXYCODONE HCL 5 MG PO TABS: 5.0000 mg | ORAL_TABLET | ORAL | Status: DC | PRN

## 2013-10-14 MED ORDER — GADOBENATE DIMEGLUMINE 529 MG/ML IV SOLN
18.0000 mL | Freq: Once | INTRAVENOUS | Status: AC | PRN
  Administered 2013-10-14: 18 mL via INTRAVENOUS

## 2013-10-14 MED ORDER — ENOXAPARIN SODIUM 40 MG/0.4ML ~~LOC~~ SOLN
40.0000 mg | SUBCUTANEOUS | Status: DC
  Administered 2013-10-15: 40 mg via SUBCUTANEOUS
  Filled 2013-10-14 (×2): qty 0.4

## 2013-10-14 MED ORDER — SODIUM CHLORIDE 0.9 % IV SOLN
3.0000 g | Freq: Four times a day (QID) | INTRAVENOUS | Status: DC
  Administered 2013-10-14 – 2013-10-16 (×7): 3 g via INTRAVENOUS
  Filled 2013-10-14 (×21): qty 3

## 2013-10-14 MED ORDER — NICOTINE 21 MG/24HR TD PT24
21.0000 mg | MEDICATED_PATCH | Freq: Every day | TRANSDERMAL | Status: DC
  Filled 2013-10-14 (×2): qty 1

## 2013-10-14 NOTE — ED Notes (Signed)
Patient complains of right flank pain with nausea/vomiting, sweats. Decreased appetite. Denies diarrhea and constipation. States onset of symptoms Sunday morning around 1am.

## 2013-10-14 NOTE — ED Notes (Signed)
Dr. Jenkins at bedside. 

## 2013-10-14 NOTE — H&P (Signed)
Bonnie Jones is an 33 y.o. female.   Chief Complaint: Right upper quadrant abdominal pain HPI: Patient is a 33 year old the female who was previously found earlier this year had cholelithiasis. She presented emergency room today with worsening right upper quadrant abdominal pain and nausea. Repeat ultrasound showed cholelithiasis with a dilated common bile duct. Her liver enzyme tests were also abnormal with an elevation of her total bilirubin. She states the last attack started several days ago. His made worse with eating. It is not been going away like it has before.  Past Medical History  Diagnosis Date  . Hypertension   . Weight decrease   . Asthma     as child  . GERD (gastroesophageal reflux disease)   . Pregnant   . Abnormal Pap smear   . Skin infection 09/16/2012    Past Surgical History  Procedure Laterality Date  . Tonsillectomy and adenoidectomy    . Mass excision  02/02/2011    Procedure: EXCISION MASS;  Surgeon: Belva Crome, MD;  Location: [facility];  Service: General;  Laterality: Left;  Excision of LEFT UPPER BACK MASS  . Back surgery    . Tonsillectomy    . Tubal ligation N/A 08/23/2012    Procedure: POST PARTUM TUBAL LIGATION with filshie clips and removal of nevi;  Surgeon: Donnamae Jude, MD;  Location: [facility];  Service: Gynecology;  Laterality: N/A;    Family History  Problem Relation Age of Onset  . Hypertension Mother   . Heart disease Mother   . Diabetes Mother   . Cancer Mother   . Other Son     sickle cell gene   Social History:  reports that she has been smoking.  She has never used smokeless tobacco. She reports that she drinks alcohol. She reports that she does not use illicit drugs.  Allergies: No Known Allergies  Medications Prior to Admission  Medication Sig Dispense Refill  . ibuprofen (ADVIL,MOTRIN) 200 MG tablet Take 200 mg by mouth every 6 (six) hours as needed (pain).         Results for orders placed during the hospital encounter of  10/14/13 (from the past 48 hour(s))  URINALYSIS, ROUTINE W REFLEX MICROSCOPIC     Status: Abnormal   Collection Time    10/14/13  8:18 AM      Result Value Ref Range   Color, Urine YELLOW  YELLOW   APPearance CLEAR  CLEAR   Specific Gravity, Urine 1.025  1.005 - 1.030   pH 5.5  5.0 - 8.0   Glucose, UA NEGATIVE  NEGATIVE mg/dL   Hgb urine dipstick NEGATIVE  NEGATIVE   Bilirubin Urine LARGE (*) NEGATIVE   Ketones, ur 40 (*) NEGATIVE mg/dL   Protein, ur TRACE (*) NEGATIVE mg/dL   Urobilinogen, UA 1.0  0.0 - 1.0 mg/dL   Nitrite NEGATIVE  NEGATIVE   Leukocytes, UA NEGATIVE  NEGATIVE  PREGNANCY, URINE     Status: None   Collection Time    10/14/13  8:18 AM      Result Value Ref Range   Preg Test, Ur NEGATIVE  NEGATIVE  URINE MICROSCOPIC-ADD ON     Status: Abnormal   Collection Time    10/14/13  8:18 AM      Result Value Ref Range   Squamous Epithelial / LPF MANY (*) RARE   WBC, UA 7-10  <3 WBC/hpf   RBC / HPF 3-6  <3 RBC/hpf   Bacteria, UA MANY (*)  RARE  CBC WITH DIFFERENTIAL     Status: None   Collection Time    10/14/13  8:25 AM      Result Value Ref Range   WBC 6.4  4.0 - 10.5 K/uL   RBC 4.18  3.87 - 5.11 MIL/uL   Hemoglobin 12.8  12.0 - 15.0 g/dL   HCT 36.8  36.0 - 46.0 %   MCV 88.0  78.0 - 100.0 fL   MCH 30.6  26.0 - 34.0 pg   MCHC 34.8  30.0 - 36.0 g/dL   RDW 13.3  11.5 - 15.5 %   Platelets 215  150 - 400 K/uL   Neutrophils Relative % 68  43 - 77 %   Neutro Abs 4.4  1.7 - 7.7 K/uL   Lymphocytes Relative 23  12 - 46 %   Lymphs Abs 1.5  0.7 - 4.0 K/uL   Monocytes Relative 6  3 - 12 %   Monocytes Absolute 0.4  0.1 - 1.0 K/uL   Eosinophils Relative 2  0 - 5 %   Eosinophils Absolute 0.1  0.0 - 0.7 K/uL   Basophils Relative 1  0 - 1 %   Basophils Absolute 0.0  0.0 - 0.1 K/uL  COMPREHENSIVE METABOLIC PANEL     Status: Abnormal   Collection Time    10/14/13  8:25 AM      Result Value Ref Range   Sodium 139  137 - 147 mEq/L   Potassium 3.7  3.7 - 5.3 mEq/L   Chloride  104  96 - 112 mEq/L   CO2 22  19 - 32 mEq/L   Glucose, Bld 91  70 - 99 mg/dL   BUN 4 (*) 6 - 23 mg/dL   Creatinine, Ser 0.64  0.50 - 1.10 mg/dL   Calcium 8.7  8.4 - 10.5 mg/dL   Total Protein 7.6  6.0 - 8.3 g/dL   Albumin 3.9  3.5 - 5.2 g/dL   AST 319 (*) 0 - 37 U/L   ALT 602 (*) 0 - 35 U/L   Alkaline Phosphatase 244 (*) 39 - 117 U/L   Total Bilirubin 2.4 (*) 0.3 - 1.2 mg/dL   GFR calc non Af Amer >90  >90 mL/min   GFR calc Af Amer >90  >90 mL/min   Comment: (NOTE)     The eGFR has been calculated using the CKD EPI equation.     This calculation has not been validated in all clinical situations.     eGFR's persistently <90 mL/min signify possible Chronic Kidney     Disease.   Anion gap 13  5 - 15  LIPASE, BLOOD     Status: None   Collection Time    10/14/13  8:25 AM      Result Value Ref Range   Lipase 24  11 - 59 U/L   US Abdomen Complete  10/14/2013   CLINICAL DATA:  Right upper quadrant pain, nausea and vomiting.  EXAM: ULTRASOUND ABDOMEN COMPLETE  COMPARISON:  Abdominal ultrasound 05/15/2013  FINDINGS: Gallbladder:  There are some small gallstones but no gallbladder wall thickening or pericholecystic fluid. Sonographer reports negative Murphy's sign.  Common bile duct:  Diameter:  0.7 - 0.9 cm compared to 0.5 cm on the prior exam  Liver:  No focal lesion identified. Within normal limits in parenchymal echogenicity.  IVC:  No abnormality visualized.  Pancreas:  Visualized portion unremarkable.  Spleen:  Size and appearance within normal limits.  Right Kidney:  Length: 10.7 cm. Echogenicity within normal limits. No mass or hydronephrosis visualized.  Left Kidney:  Length: 11.6 cm. Echogenicity within normal limits. No mass or hydronephrosis visualized.  Abdominal aorta:  No aneurysm visualized.  Other findings:  None.  IMPRESSION: Gallstones without evidence of cholecystitis.  The common bile duct is prominent at 0.7 - 0.9 cm compared to 0.5 cm on the prior study. No ductal stone is  identified. ERCP or MRCP could be used for further evaluation if indicated.   Electronically Signed   By: Inge Rise M.D.   On: 10/14/2013 10:20    Review of Systems  Constitutional: Positive for malaise/fatigue.  HENT: Negative.   Eyes: Negative.   Respiratory: Negative.   Cardiovascular: Negative.   Gastrointestinal: Positive for nausea and abdominal pain.  Genitourinary: Negative.   Musculoskeletal: Negative.   Skin: Negative.     Blood pressure 151/72, pulse 60, temperature 98 F (36.7 C), temperature source Oral, resp. rate 20, height '5\' 6"'  (1.676 m), weight 88.905 kg (196 lb), last menstrual period 10/06/2013, SpO2 100.00%. Physical Exam  Vitals reviewed. Constitutional: She is oriented to person, place, and time. She appears well-developed and well-nourished.  Eyes: No scleral icterus.  Neck: Normal range of motion. Neck supple.  Cardiovascular: Normal rate, regular rhythm and normal heart sounds.   Respiratory: Effort normal and breath sounds normal.  GI: Soft. She exhibits no distension. There is tenderness.  Tender in right upper quadrant to palpation. No rigidity noted.  Neurological: She is alert and oriented to person, place, and time.  Skin: Skin is warm and dry.     Assessment/Plan Impression: Cholecystitis, cholelithiasis, dilated common bile duct with elevated bilirubin level Plan: Patient in the hospital for IV antibiotics, pain control, nausea control, and an MRCP to assess the common bile duct. He will ultimately need laparoscopic cholecystectomy. We'll monitor liver enzyme tests  Sadarius Norman A 10/14/2013, 2:14 PM

## 2013-10-14 NOTE — Progress Notes (Signed)
CRITICAL VALUE ALERT  Critical value received:  Positive staph aureus  Date of notification:  10/14/2013  Time of notification:  6553  Critical value read back:Yes.    Nurse who received alert:  Lonzo Cloud RN  MD notified (1st page): Arnoldo Morale  Time of first page:  7:00pm  MD notified (2nd page):  Time of second pa  Responding MD:  Arnoldo Morale  Time MD responded:  [id number]  No new orders received.

## 2013-10-14 NOTE — ED Provider Notes (Signed)
CSN: [id number]     Arrival date & time 10/14/13  0749 History   First MD Initiated Contact with Patient 10/14/13 0759    This chart was scribed for Bonnie Diego, MD by Edison Simon, ED Scribe. This patient was seen in room APA19/APA19 and the patient's care was started at 8:01 AM.     Chief Complaint  Patient presents with  . Flank Pain   Patient is a 33 y.o. female presenting with flank pain. The history is provided by the patient. No language interpreter was used.  Flank Pain This is a recurrent problem. The current episode started more than 2 days ago. The problem occurs constantly. The problem has not changed since onset.Pertinent negatives include no chest pain and no headaches. Nothing aggravates the symptoms. Relieved by: Ibuprofen. She has tried nothing for the symptoms.    HPI Comments: [name] is a 33 y.o. female who presents to the Emergency Department complaining of diffuse right-sided flank pain with onset 3 days ago. She reports history of gallstones but states she has not had a cholecystectomy yet, though it has been recommended. She states previous pain from gallstones have not been as severe as this occasion. She reports associated nausea, vomiting, and dark urine. She denies dysuria or hematuria.  Past Medical History  Diagnosis Date  . Hypertension   . Weight decrease   . Asthma     as child  . GERD (gastroesophageal reflux disease)   . Pregnant   . Abnormal Pap smear   . Skin infection 09/16/2012   Past Surgical History  Procedure Laterality Date  . Tonsillectomy and adenoidectomy    . Mass excision  02/02/2011    Procedure: EXCISION MASS;  Surgeon: Belva Crome, MD;  Location: [facility];  Service: General;  Laterality: Left;  Excision of LEFT UPPER BACK MASS  . Back surgery    . Tonsillectomy    . Tubal ligation N/A 08/23/2012    Procedure: POST PARTUM TUBAL LIGATION with filshie clips and removal of nevi;  Surgeon: Donnamae Jude, MD;  Location: [facility];  Service: Gynecology;  Laterality: N/A;   Family History  Problem Relation Age of Onset  . Hypertension Mother   . Heart disease Mother   . Diabetes Mother   . Cancer Mother   . Other Son     sickle cell gene   History  Substance Use Topics  . Smoking status: Current Every Day Smoker -- 1.00 packs/day for 10 years  . Smokeless tobacco: Never Used  . Alcohol Use: 0.0 oz/week     Comment: beer occ   OB History   Grav Para Term Preterm Abortions TAB SAB Ect Mult Living   3 2 2  1 1    2      Review of Systems  Constitutional: Positive for appetite change. Negative for fatigue.  HENT: Negative for congestion, ear discharge and sinus pressure.   Eyes: Negative for discharge.  Respiratory: Negative for cough.   Cardiovascular: Negative for chest pain.  Gastrointestinal: Positive for nausea and vomiting. Negative for diarrhea and constipation.  Genitourinary: Positive for flank pain. Negative for dysuria, frequency and hematuria.  Musculoskeletal: Negative for back pain.  Skin: Negative for rash.       Diaphoresis  Neurological: Negative for seizures and headaches.  Psychiatric/Behavioral: Negative for hallucinations.      Allergies  Review of patient's allergies indicates no known allergies.  Home Medications   Prior to Admission  medications   Medication Sig Start Date End Date Taking? Authorizing Provider  dexamethasone (DECADRON) 4 MG tablet 1 po bid with food 07/25/13   Lenox Ahr, PA-C  diclofenac (VOLTAREN) 75 MG EC tablet Take 1 tablet (75 mg total) by mouth 2 (two) times daily. 07/25/13   Lenox Ahr, PA-C  HYDROcodone-acetaminophen (NORCO) 5-325 MG per tablet Take 2 tablets by mouth every 4 (four) hours as needed. 07/30/13   Mariea Clonts, MD  ondansetron (ZOFRAN ODT) 4 MG disintegrating tablet 4mg  ODT q4 hours prn nausea/vomit 07/30/13   Mariea Clonts, MD   BP 151/90  Pulse 72  Temp(Src) 98.3 F (36.8 C) (Oral)  Resp 18  Ht 5\' 6"  (1.676 m)  Wt  196 lb (88.905 kg)  BMI 31.65 kg/m2  SpO2 100% Physical Exam  Constitutional: She is oriented to person, place, and time. She appears well-developed.  HENT:  Head: Normocephalic.  Eyes: Conjunctivae and EOM are normal. No scleral icterus.  Neck: Neck supple. No thyromegaly present.  Cardiovascular: Normal rate and regular rhythm.  Exam reveals no gallop and no friction rub.   No murmur heard. Pulmonary/Chest: No stridor. She has no wheezes. She has no rales. She exhibits no tenderness.  Abdominal: She exhibits no distension. There is tenderness (moderate RUQ tenderness). There is no rebound.  Musculoskeletal: Normal range of motion. She exhibits no edema.  Lymphadenopathy:    She has no cervical adenopathy.  Neurological: She is oriented to person, place, and time. She exhibits normal muscle tone. Coordination normal.  Skin: No rash noted. No erythema.  Psychiatric: She has a normal mood and affect. Her behavior is normal.    ED Course  Procedures (including critical care time) Labs Review Labs Reviewed - No data to display  Imaging Review No results found.   EKG Interpretation None     DIAGNOSTIC STUDIES: Oxygen Saturation is 100% on room air, normal by my interpretation.    COORDINATION OF CARE:    MDM   Final diagnoses:  None   The chart was scribed for me under my direct supervision.  I personally performed the history, physical, and medical decision making and all procedures in the evaluation of this patient.Bonnie Diego, MD 10/14/13 1215

## 2013-10-15 LAB — HEPATIC FUNCTION PANEL
ALK PHOS: 217 U/L — AB (ref 39–117)
ALT: 471 U/L — ABNORMAL HIGH (ref 0–35)
AST: 192 U/L — AB (ref 0–37)
Albumin: 3.4 g/dL — ABNORMAL LOW (ref 3.5–5.2)
Bilirubin, Direct: 0.4 mg/dL — ABNORMAL HIGH (ref 0.0–0.3)
Indirect Bilirubin: 0.5 mg/dL (ref 0.3–0.9)
Total Bilirubin: 0.9 mg/dL (ref 0.3–1.2)
Total Protein: 6.9 g/dL (ref 6.0–8.3)

## 2013-10-15 LAB — BASIC METABOLIC PANEL
Anion gap: 12 (ref 5–15)
BUN: 4 mg/dL — ABNORMAL LOW (ref 6–23)
CHLORIDE: 104 meq/L (ref 96–112)
CO2: 24 meq/L (ref 19–32)
CREATININE: 0.66 mg/dL (ref 0.50–1.10)
Calcium: 8.6 mg/dL (ref 8.4–10.5)
GFR calc non Af Amer: >90 mL/min (ref >90)
Glucose, Bld: 86 mg/dL (ref 70–99)
Potassium: 3.8 mEq/L (ref 3.7–5.3)
Sodium: 140 mEq/L (ref 137–147)

## 2013-10-15 LAB — CBC
HCT: 34.9 % — ABNORMAL LOW (ref 36.0–46.0)
Hemoglobin: 11.9 g/dL — ABNORMAL LOW (ref 12.0–15.0)
MCH: 30.4 pg (ref 26.0–34.0)
MCHC: 34.1 g/dL (ref 30.0–36.0)
MCV: 89 fL (ref 78.0–100.0)
Platelets: 204 10*3/uL (ref 150–400)
RBC: 3.92 MIL/uL (ref 3.87–5.11)
RDW: 13.5 % (ref 11.5–15.5)
WBC: 5.1 10*3/uL (ref 4.0–10.5)

## 2013-10-15 LAB — PHOSPHORUS: Phosphorus: 3.1 mg/dL (ref 2.3–4.6)

## 2013-10-15 LAB — MAGNESIUM: Magnesium: 2 mg/dL (ref 1.5–2.5)

## 2013-10-15 MED ORDER — MUPIROCIN 2 % EX OINT
1.0000 "application " | TOPICAL_OINTMENT | Freq: Two times a day (BID) | CUTANEOUS | Status: DC
  Administered 2013-10-15 – 2013-10-16 (×3): 1 via NASAL
  Filled 2013-10-15: qty 22

## 2013-10-15 MED ORDER — CHLORHEXIDINE GLUCONATE 4 % EX LIQD
1.0000 "application " | Freq: Once | CUTANEOUS | Status: AC
  Administered 2013-10-15: 1 via TOPICAL
  Filled 2013-10-15: qty 15

## 2013-10-15 MED ORDER — CHLORHEXIDINE GLUCONATE CLOTH 2 % EX PADS
6.0000 | MEDICATED_PAD | Freq: Every day | CUTANEOUS | Status: DC
  Administered 2013-10-15 – 2013-10-16 (×2): 6 via TOPICAL

## 2013-10-15 NOTE — Care Management Note (Signed)
    Page 1 of 1   10/15/2013     1:58:02 PM CARE MANAGEMENT NOTE 10/15/2013  Patient:  Bonnie Jones,Bonnie Jones   Account Number:  [id number]  Date Initiated:  10/15/2013  Documentation initiated by:  Theophilus Kinds  Subjective/Objective Assessment:   Pt admitted from home with cholecystitis. Pt lives with her children and will return home at discharge. Pt is scheduled to have surgery on 10/16/13.     Action/Plan:   No CM needs anticipated.   Anticipated DC Date:  10/17/2013   Anticipated DC Plan:  [place]  CM consult      Choice offered to / List presented to:             Status of service:  Completed, signed off Medicare Important Message given?   (If response is "NO", the following Medicare IM given date fields will be blank) Date Medicare IM given:   Medicare IM given by:   Date Additional Medicare IM given:   Additional Medicare IM given by:    Discharge Disposition:  HOME/SELF CARE  Per UR Regulation:    If discussed at Long Length of Stay Meetings, dates discussed:    Comments:  10/15/13 [place], RN BSN CM

## 2013-10-15 NOTE — Progress Notes (Signed)
UR chart review completed.  

## 2013-10-15 NOTE — Progress Notes (Signed)
Subjective: Patient still with mild right upper quadrant abdominal pain.  Objective: Vital signs in last 24 hours: Temp:  [97.8 F (36.6 C)-98.5 F (36.9 C)] 97.8 F (36.6 C) (09/03 0550) Pulse Rate:  [54-68] 57 (09/03 0550) Resp:  [13-20] 20 (09/03 0550) BP: (122-151)/(72-87) 122/87 mmHg (09/03 0550) SpO2:  [99 %-100 %] 99 % (09/03 0550) Weight:  [87.544 kg (193 lb)] 87.544 kg (193 lb) (09/02 1413) Last BM Date: 10/14/13  Intake/Output from previous day: 09/02 0701 - 09/03 0700 In: 1930 [I.V.:1630; IV Piggyback:300] Out: -  Intake/Output this shift:    General appearance: alert, cooperative and no distress Resp: clear to auscultation bilaterally Cardio: regular rate and rhythm, S1, S2 normal, no murmur, click, rub or gallop GI: Soft, slightly tender in right upper quadrant to palpation. No rigidity noted.  Lab Results:   Recent Labs  10/14/13 0825 10/15/13 0526  WBC 6.4 5.1  HGB 12.8 11.9*  HCT 36.8 34.9*  PLT 215 204   BMET  Recent Labs  10/14/13 0825 10/15/13 0526  NA 139 140  K 3.7 3.8  CL 104 104  CO2 22 24  GLUCOSE 91 86  BUN 4* 4*  CREATININE 0.64 0.66  CALCIUM 8.7 8.6   PT/INR No results found for this basename: LABPROT, INR,  in the last 72 hours  Studies/Results: US Abdomen Complete  10/14/2013   CLINICAL DATA:  Right upper quadrant pain, nausea and vomiting.  EXAM: ULTRASOUND ABDOMEN COMPLETE  COMPARISON:  Abdominal ultrasound 05/15/2013  FINDINGS: Gallbladder:  There are some small gallstones but no gallbladder wall thickening or pericholecystic fluid. Sonographer reports negative Murphy's sign.  Common bile duct:  Diameter:  0.7 - 0.9 cm compared to 0.5 cm on the prior exam  Liver:  No focal lesion identified. Within normal limits in parenchymal echogenicity.  IVC:  No abnormality visualized.  Pancreas:  Visualized portion unremarkable.  Spleen:  Size and appearance within normal limits.  Right Kidney:  Length: 10.7 cm. Echogenicity within  normal limits. No mass or hydronephrosis visualized.  Left Kidney:  Length: 11.6 cm. Echogenicity within normal limits. No mass or hydronephrosis visualized.  Abdominal aorta:  No aneurysm visualized.  Other findings:  None.  IMPRESSION: Gallstones without evidence of cholecystitis.  The common bile duct is prominent at 0.7 - 0.9 cm compared to 0.5 cm on the prior study. No ductal stone is identified. ERCP or MRCP could be used for further evaluation if indicated.   Electronically Signed   By: Inge Rise M.D.   On: 10/14/2013 10:20   Mr 3d Recon At Scanner  10/15/2013   CLINICAL DATA:  Cholelithiasis. Dilated common bile duct on ultrasound.  EXAM: MRI ABDOMEN WITHOUT AND WITH CONTRAST (INCLUDING MRCP)  TECHNIQUE: Multiplanar multisequence MR imaging of the abdomen was performed both before and after the administration of intravenous contrast. Heavily T2-weighted images of the biliary and pancreatic ducts were obtained, and three-dimensional MRCP images were rendered by post processing.  CONTRAST:  57mL MULTIHANCE GADOBENATE DIMEGLUMINE 529 MG/ML IV SOLN  COMPARISON:  Ultrasound 10/14/2013.  CT of 04/27/2008.  FINDINGS: Portions of exam are minimally motion degraded. Trace bilateral pleural effusions. Heart size upper normal.  Normal liver, without intrahepatic biliary ductal dilatation.  Normal spleen, stomach, adrenal glands, kidneys. Normal pancreas, without pancreatic ductal dilatation.  Multiple gallstones.  The common duct is upper normal for age, 7 mm. No evidence of choledocholithiasis.  No abdominal adenopathy or ascites. Abdominal bowel loops within normal limits.  IMPRESSION: 1. Cholelithiasis.  2. Common duct upper normal in size, without evidence of choledocholithiasis. 3. Minimal motion degradation. 4. Trace bilateral pleural effusions.   Electronically Signed   By: Abigail Miyamoto M.D.   On: 10/15/2013 08:20   Mr Abd W/wo Cm/mrcp  10/15/2013   CLINICAL DATA:  Cholelithiasis. Dilated common bile  duct on ultrasound.  EXAM: MRI ABDOMEN WITHOUT AND WITH CONTRAST (INCLUDING MRCP)  TECHNIQUE: Multiplanar multisequence MR imaging of the abdomen was performed both before and after the administration of intravenous contrast. Heavily T2-weighted images of the biliary and pancreatic ducts were obtained, and three-dimensional MRCP images were rendered by post processing.  CONTRAST:  81mL MULTIHANCE GADOBENATE DIMEGLUMINE 529 MG/ML IV SOLN  COMPARISON:  Ultrasound 10/14/2013.  CT of 04/27/2008.  FINDINGS: Portions of exam are minimally motion degraded. Trace bilateral pleural effusions. Heart size upper normal.  Normal liver, without intrahepatic biliary ductal dilatation.  Normal spleen, stomach, adrenal glands, kidneys. Normal pancreas, without pancreatic ductal dilatation.  Multiple gallstones.  The common duct is upper normal for age, 7 mm. No evidence of choledocholithiasis.  No abdominal adenopathy or ascites. Abdominal bowel loops within normal limits.  IMPRESSION: 1. Cholelithiasis. 2. Common duct upper normal in size, without evidence of choledocholithiasis. 3. Minimal motion degradation. 4. Trace bilateral pleural effusions.   Electronically Signed   By: Abigail Miyamoto M.D.   On: 10/15/2013 08:20    Anti-infectives: Anti-infectives   Start     Dose/Rate Route Frequency Ordered Stop   10/14/13 1600  Ampicillin-Sulbactam (UNASYN) 3 g in sodium chloride 0.9 % 100 mL IVPB     3 g 100 mL/hr over 60 Minutes Intravenous Every 6 hours 10/14/13 1417        Assessment/Plan: s/p Procedure(s): LAPAROSCOPIC CHOLECYSTECTOMY Impression: MRCP is negative for choledocholithiasis. Liver enzyme tests improving. Total bilirubin normalizing. We'll proceed with laparoscopic cholecystectomy. The risks and benefits of the procedure including bleeding, infection, hepatobiliary injury, the possibility of an open procedure were fully explained to the patient, who gave informed consent.  LOS: 1 day    Bonnie Jones  A 10/15/2013

## 2013-10-16 ENCOUNTER — Encounter (HOSPITAL_COMMUNITY)
Admission: EM | Disposition: A | Discharge status: Home / Self Care | Payer: Self-pay | Source: Home / Self Care | Attending: General Surgery

## 2013-10-16 ENCOUNTER — Encounter (HOSPITAL_COMMUNITY): Payer: 59 | Admitting: Anesthesiology

## 2013-10-16 ENCOUNTER — Inpatient Hospital Stay (HOSPITAL_COMMUNITY): Payer: 59 | Admitting: Anesthesiology

## 2013-10-16 HISTORY — PX: CHOLECYSTECTOMY: SHX55

## 2013-10-16 SURGERY — LAPAROSCOPIC CHOLECYSTECTOMY
Anesthesia: General | Site: Abdomen

## 2013-10-16 MED ORDER — SODIUM CHLORIDE 0.9 % IR SOLN
Status: DC | PRN
  Administered 2013-10-16: 500 mL

## 2013-10-16 MED ORDER — HYDROCODONE-ACETAMINOPHEN 5-325 MG PO TABS
1.0000 | ORAL_TABLET | ORAL | Status: DC | PRN
  Administered 2013-10-16: 1 via ORAL
  Filled 2013-10-16: qty 1

## 2013-10-16 MED ORDER — GLYCOPYRROLATE 0.2 MG/ML IJ SOLN
INTRAMUSCULAR | Status: DC | PRN
  Administered 2013-10-16: 0.4 mg via INTRAVENOUS

## 2013-10-16 MED ORDER — PROPOFOL 10 MG/ML IV BOLUS
INTRAVENOUS | Status: DC | PRN
  Administered 2013-10-16: 150 mg via INTRAVENOUS

## 2013-10-16 MED ORDER — MIDAZOLAM HCL 2 MG/2ML IJ SOLN
1.0000 mg | INTRAMUSCULAR | Status: DC | PRN
  Administered 2013-10-16 (×2): 2 mg via INTRAVENOUS

## 2013-10-16 MED ORDER — KETOROLAC TROMETHAMINE 30 MG/ML IJ SOLN
INTRAMUSCULAR | Status: AC
  Filled 2013-10-16: qty 1

## 2013-10-16 MED ORDER — FENTANYL CITRATE 0.05 MG/ML IJ SOLN
INTRAMUSCULAR | Status: AC
  Filled 2013-10-16: qty 2

## 2013-10-16 MED ORDER — MIDAZOLAM HCL 2 MG/2ML IJ SOLN
INTRAMUSCULAR | Status: AC
Start: 2013-10-16 — End: 2013-10-16
  Filled 2013-10-16: qty 2

## 2013-10-16 MED ORDER — LIDOCAINE HCL (PF) 1 % IJ SOLN
INTRAMUSCULAR | Status: AC
  Filled 2013-10-16: qty 5

## 2013-10-16 MED ORDER — POVIDONE-IODINE 10 % OINT PACKET
TOPICAL_OINTMENT | CUTANEOUS | Status: DC | PRN
  Administered 2013-10-16: 1 via TOPICAL

## 2013-10-16 MED ORDER — FENTANYL CITRATE 0.05 MG/ML IJ SOLN
INTRAMUSCULAR | Status: AC
  Filled 2013-10-16: qty 5

## 2013-10-16 MED ORDER — ONDANSETRON HCL 4 MG/2ML IJ SOLN: 4.0000 mg | Freq: Once | INTRAMUSCULAR | Status: DC | PRN

## 2013-10-16 MED ORDER — KETOROLAC TROMETHAMINE 30 MG/ML IJ SOLN
30.0000 mg | Freq: Once | INTRAMUSCULAR | Status: AC
  Administered 2013-10-16: 30 mg via INTRAVENOUS

## 2013-10-16 MED ORDER — BUPIVACAINE HCL (PF) 0.5 % IJ SOLN
INTRAMUSCULAR | Status: DC | PRN
  Administered 2013-10-16: 10 mL

## 2013-10-16 MED ORDER — ONDANSETRON HCL 4 MG/2ML IJ SOLN
INTRAMUSCULAR | Status: AC
  Filled 2013-10-16: qty 2

## 2013-10-16 MED ORDER — FENTANYL CITRATE 0.05 MG/ML IJ SOLN
INTRAMUSCULAR | Status: DC | PRN
  Administered 2013-10-16 (×2): 100 ug via INTRAVENOUS
  Administered 2013-10-16: 50 ug via INTRAVENOUS

## 2013-10-16 MED ORDER — MIDAZOLAM HCL 2 MG/2ML IJ SOLN
INTRAMUSCULAR | Status: AC
  Filled 2013-10-16: qty 2

## 2013-10-16 MED ORDER — HEMOSTATIC AGENTS (NO CHARGE) OPTIME
TOPICAL | Status: DC | PRN
  Administered 2013-10-16: 1

## 2013-10-16 MED ORDER — FENTANYL CITRATE 0.05 MG/ML IJ SOLN
25.0000 ug | INTRAMUSCULAR | Status: DC | PRN
  Administered 2013-10-16 (×2): 50 ug via INTRAVENOUS

## 2013-10-16 MED ORDER — GLYCOPYRROLATE 0.2 MG/ML IJ SOLN
INTRAMUSCULAR | Status: AC
  Filled 2013-10-16: qty 2

## 2013-10-16 MED ORDER — MIDAZOLAM HCL 5 MG/5ML IJ SOLN
INTRAMUSCULAR | Status: DC | PRN
  Administered 2013-10-16: 2 mg via INTRAVENOUS

## 2013-10-16 MED ORDER — LACTATED RINGERS IV SOLN
INTRAVENOUS | Status: DC
  Administered 2013-10-16: 1000 mL via INTRAVENOUS
  Administered 2013-10-16: 11:00:00 via INTRAVENOUS

## 2013-10-16 MED ORDER — NEOSTIGMINE METHYLSULFATE 10 MG/10ML IV SOLN
INTRAVENOUS | Status: DC | PRN
  Administered 2013-10-16 (×2): 2 mg via INTRAVENOUS

## 2013-10-16 MED ORDER — ROCURONIUM BROMIDE 50 MG/5ML IV SOLN
INTRAVENOUS | Status: AC
  Filled 2013-10-16: qty 1

## 2013-10-16 MED ORDER — LIDOCAINE HCL 1 % IJ SOLN
INTRAMUSCULAR | Status: DC | PRN
  Administered 2013-10-16: 50 mg via INTRADERMAL

## 2013-10-16 MED ORDER — ONDANSETRON HCL 4 MG/2ML IJ SOLN
4.0000 mg | Freq: Once | INTRAMUSCULAR | Status: AC
  Administered 2013-10-16: 4 mg via INTRAVENOUS

## 2013-10-16 MED ORDER — PROPOFOL 10 MG/ML IV EMUL
INTRAVENOUS | Status: AC
  Filled 2013-10-16: qty 20

## 2013-10-16 MED ORDER — ROCURONIUM BROMIDE 100 MG/10ML IV SOLN
INTRAVENOUS | Status: DC | PRN
  Administered 2013-10-16: 30 mg via INTRAVENOUS

## 2013-10-16 MED ORDER — LACTATED RINGERS IV SOLN: INTRAVENOUS | Status: DC

## 2013-10-16 MED ORDER — IBUPROFEN 800 MG PO TABS: 800.0000 mg | ORAL_TABLET | Freq: Three times a day (TID) | ORAL | Status: DC | PRN

## 2013-10-16 MED ORDER — BUPIVACAINE HCL (PF) 0.5 % IJ SOLN
INTRAMUSCULAR | Status: AC
  Filled 2013-10-16: qty 30

## 2013-10-16 MED ORDER — POVIDONE-IODINE 10 % EX OINT
TOPICAL_OINTMENT | CUTANEOUS | Status: AC
  Filled 2013-10-16: qty 1

## 2013-10-16 MED ORDER — HYDROCODONE-ACETAMINOPHEN 5-325 MG PO TABS: 1.0000 | ORAL_TABLET | ORAL | Status: AC | PRN

## 2013-10-16 SURGICAL SUPPLY — 41 items
APPLIER CLIP LAPSCP 10X32 DD (CLIP) ×3 IMPLANT
BAG HAMPER (MISCELLANEOUS) ×3 IMPLANT
BLADE 11 SAFETY STRL DISP (BLADE) ×3 IMPLANT
CLOTH BEACON ORANGE TIMEOUT ST (SAFETY) ×3 IMPLANT
COVER LIGHT HANDLE STERIS (MISCELLANEOUS) ×6 IMPLANT
DECANTER SPIKE VIAL GLASS SM (MISCELLANEOUS) ×3 IMPLANT
DURAPREP 26ML APPLICATOR (WOUND CARE) ×3 IMPLANT
ELECT REM PT RETURN 9FT ADLT (ELECTROSURGICAL) ×3
ELECTRODE REM PT RTRN 9FT ADLT (ELECTROSURGICAL) ×1 IMPLANT
FILTER SMOKE EVAC LAPAROSHD (FILTER) ×3 IMPLANT
FORMALIN 10 PREFIL 120ML (MISCELLANEOUS) ×3 IMPLANT
GLOVE BIOGEL PI IND STRL 7.0 (GLOVE) ×1 IMPLANT
GLOVE BIOGEL PI IND STRL 7.5 (GLOVE) ×1 IMPLANT
GLOVE BIOGEL PI INDICATOR 7.0 (GLOVE) ×2
GLOVE BIOGEL PI INDICATOR 7.5 (GLOVE) ×2
GLOVE ECLIPSE 7.0 STRL STRAW (GLOVE) ×3 IMPLANT
GLOVE EXAM NITRILE LRG STRL (GLOVE) ×3 IMPLANT
GLOVE SURG SS PI 7.5 STRL IVOR (GLOVE) ×6 IMPLANT
GOWN STRL REUS W/TWL LRG LVL3 (GOWN DISPOSABLE) ×9 IMPLANT
HEMOSTAT SNOW SURGICEL 2X4 (HEMOSTASIS) ×3 IMPLANT
INST SET LAPROSCOPIC AP (KITS) ×3 IMPLANT
KIT ROOM TURNOVER APOR (KITS) ×3 IMPLANT
MANIFOLD NEPTUNE II (INSTRUMENTS) ×3 IMPLANT
NEEDLE INSUFFLATION 14GA 120MM (NEEDLE) ×3 IMPLANT
NS IRRIG 500ML POUR BTL (IV SOLUTION) ×3 IMPLANT
PACK LAP CHOLE LZT030E (CUSTOM PROCEDURE TRAY) ×3 IMPLANT
PAD ARMBOARD 7.5X6 YLW CONV (MISCELLANEOUS) ×3 IMPLANT
POUCH SPECIMEN RETRIEVAL 10MM (ENDOMECHANICALS) ×3 IMPLANT
SET BASIN LINEN APH (SET/KITS/TRAYS/PACK) ×3 IMPLANT
SLEEVE ENDOPATH XCEL 5M (ENDOMECHANICALS) ×3 IMPLANT
SPONGE GAUZE 2X2 8PLY STER LF (GAUZE/BANDAGES/DRESSINGS) ×4
SPONGE GAUZE 2X2 8PLY STRL LF (GAUZE/BANDAGES/DRESSINGS) ×8 IMPLANT
STAPLER VISISTAT (STAPLE) ×3 IMPLANT
SUT VICRYL 0 UR6 27IN ABS (SUTURE) ×3 IMPLANT
TAPE CLOTH SURG 4X10 WHT LF (GAUZE/BANDAGES/DRESSINGS) ×3 IMPLANT
TROCAR ENDO BLADELESS 11MM (ENDOMECHANICALS) ×3 IMPLANT
TROCAR XCEL NON-BLD 5MMX100MML (ENDOMECHANICALS) ×3 IMPLANT
TROCAR XCEL UNIV SLVE 11M 100M (ENDOMECHANICALS) ×3 IMPLANT
TUBING INSUFFLATION (TUBING) ×3 IMPLANT
WARMER LAPAROSCOPE (MISCELLANEOUS) ×3 IMPLANT
YANKAUER SUCT 12FT TUBE ARGYLE (SUCTIONS) ×3 IMPLANT

## 2013-10-16 NOTE — Transfer of Care (Signed)
Immediate Anesthesia Transfer of Care Note  Patient: Bonnie Jones  Procedure(s) Performed: Procedure(s): LAPAROSCOPIC CHOLECYSTECTOMY (N/A)  Patient Location: PACU  Anesthesia Type:General  Level of Consciousness: awake and patient cooperative  Airway & Oxygen Therapy: Patient Spontanous Breathing and Patient connected to face mask oxygen  Post-op Assessment: Report given to PACU RN, Post -op Vital signs reviewed and stable and Patient moving all extremities  Post vital signs: Reviewed and stable  Complications: No apparent anesthesia complications

## 2013-10-16 NOTE — Progress Notes (Signed)
Pt stated she had eaten jello, pudding, and ice cream.  Pt stated she had also passed gas several times.  Pt walked to nursing station and back to room with steady gait.  Pt denies nausea and has active bowel sounds in all 4 quadrants.

## 2013-10-16 NOTE — Anesthesia Postprocedure Evaluation (Signed)
  Anesthesia Post-op Note  Patient: Bonnie Jones  Procedure(s) Performed: Procedure(s): LAPAROSCOPIC CHOLECYSTECTOMY (N/A)  Patient Location: PACU  Anesthesia Type:General  Level of Consciousness: awake, alert , oriented and patient cooperative  Airway and Oxygen Therapy: Patient Spontanous Breathing  Post-op Pain: 2 /10, mild  Post-op Assessment: Post-op Vital signs reviewed, Patient's Cardiovascular Status Stable, Respiratory Function Stable, Patent Airway, No signs of Nausea or vomiting and Pain level controlled  Post-op Vital Signs: Reviewed and stable  Last Vitals:  Filed Vitals:   10/16/13 1035  BP: 139/89  Pulse:   Temp:   Resp: 45    Complications: No apparent anesthesia complications

## 2013-10-16 NOTE — Op Note (Signed)
Patient:  Bonnie Jones  DOB:  1980/08/02  MRN:  [id number]   Preop Diagnosis:  Cholecystitis, cholelithiasis  Postop Diagnosis:  Same  Procedure:  Laparoscopic cholecystectomy  Surgeon:  Aviva Signs, M.D.  Anes:  General endotracheal  Indications:  Patient is a 33 year old black female who was admitted with cholecystitis and cholelithiasis. She underwent an MRCP which was negative for choledocholithiasis. The risks and benefits of the procedure including bleeding, infection, hepatobiliary injury, the possibility of an open procedure were fully explained to the patient, who informed consent.  Procedure note:  Patient is placed the supine position. After induction of general endotracheal anesthesia, the abdomen was prepped and draped using usual sterile technique with DuraPrep. Surgical site confirmation was performed.  A supraumbilical incision was made down to the fascia. A Veress needle was introduced into the abdominal cavity and confirmation of placement was done using the saline drop test. The abdomen was then insufflated to 16 mm mercury pressure. An 11 mm trocar was introduced into the abdominal cavity under direct visualization the difficulty. Patient was placed in reverse Trendelenburg position and additional 11 mm trocar was placed the epigastric region and 5 mm trochars were placed the right upper quadrant and right flank regions. Was inspected and noted within normal limits. The gallbladder was retracted in a dynamic fashion in order to expose the triangle of calot. The cystic duct was first identified. Its juncture to the infundibulum was fully identified. Endoclips were placed proximally and distally on the cystic duct, and the cystic duct was divided. This was likewise done to the cystic artery. The gallbladder was then freed away from the gallbladder fossa using Bovie electrocautery. The gallbladder was delivered through the epigastric are site using an Endo Catch bag. The  gallbladder fossa was inspected no abnormal bleeding or bile leakage was noted. Surgicel is placed the gallbladder fossa. All fluid and air were then evacuated from the abdominal cavity prior to removal the trochars.  All wounds were irrigated with normal saline. All wounds were checked with 0.5% Sensorcaine. The supraumbilical fascia was reapproximated using 0 Vicryl interrupted suture. All skin incisions were closed using staples. Betadine ointment and dry sterile dressings were applied.  All tape and needle counts were correct at the end of the procedure. Patient was extubated in the operating room and transferred to PACU in stable condition.  Complications:  None  EBL:  Minimal  Specimen:  Gallbladder

## 2013-10-16 NOTE — Progress Notes (Signed)
Removed pt. IV w/o dificulty.  Discharge teaching done with pt and father.  Pt verbalized understanding with teachback with regard to appointments, medications and when to call MD.  All belongings collected.  Pt taken via wheelchair to lobby where car was waiting

## 2013-10-16 NOTE — Progress Notes (Signed)
Received report from [place], RN, introduced self to pt, addressed needs.  Bed in lowest position, call bell in reach.  Will continue to monitor.  Encouraged pt to eat and drink, pt stated she had not passed gas.  Pt up walking in room to bathroom.

## 2013-10-16 NOTE — Anesthesia Preprocedure Evaluation (Signed)
Anesthesia Evaluation  Patient identified by MRN, date of birth, ID band Patient awake    Reviewed: Allergy & Precautions, H&P , NPO status , Patient's Chart, lab work & pertinent test results  Airway Mallampati: I TM Distance: >3 FB     Dental  (+) Teeth Intact   Pulmonary asthma , Current Smoker,  breath sounds clear to auscultation        Cardiovascular hypertension (off meds now), Rhythm:Regular     Neuro/Psych    GI/Hepatic   Endo/Other    Renal/GU      Musculoskeletal   Abdominal   Peds  Hematology   Anesthesia Other Findings   Reproductive/Obstetrics                           Anesthesia Physical Anesthesia Plan  ASA: II  Anesthesia Plan: General   Post-op Pain Management:    Induction: Intravenous  Airway Management Planned: Oral ETT  Additional Equipment:   Intra-op Plan:   Post-operative Plan: Extubation in OR  Informed Consent: I have reviewed the patients History and Physical, chart, labs and discussed the procedure including the risks, benefits and alternatives for the proposed anesthesia with the patient or authorized representative who has indicated his/her understanding and acceptance.     Plan Discussed with:   Anesthesia Plan Comments:         Anesthesia Quick Evaluation

## 2013-10-16 NOTE — Discharge Instructions (Signed)

## 2013-10-16 NOTE — Anesthesia Procedure Notes (Signed)
Procedure Name: Intubation Date/Time: 10/16/2013 10:50 AM Performed by: Charmaine Downs Pre-anesthesia Checklist: Patient being monitored, Suction available, Emergency Drugs available and Patient identified Patient Re-evaluated:Patient Re-evaluated prior to inductionOxygen Delivery Method: Circle system utilized Preoxygenation: Pre-oxygenation with 100% oxygen Intubation Type: IV induction Ventilation: Mask ventilation without difficulty and Oral airway inserted - appropriate to patient size Laryngoscope Size: Mac and 3 Grade View: Grade II Tube type: Oral Tube size: 7.0 mm Number of attempts: 2 Airway Equipment and Method: Stylet Placement Confirmation: ETT inserted through vocal cords under direct vision,  positive ETCO2 and breath sounds checked- equal and bilateral Secured at: 22 cm Tube secured with: Tape Dental Injury: Teeth and Oropharynx as per pre-operative assessment  Future Recommendations: Recommend- induction with short-acting agent, and alternative techniques readily available

## 2013-10-20 ENCOUNTER — Encounter (HOSPITAL_COMMUNITY): Payer: Self-pay | Admitting: General Surgery

## 2013-10-21 NOTE — Discharge Summary (Signed)
Physician Discharge Summary  Patient ID: Bonnie Jones MRN: [id number] DOB/AGE: [birth year] 35 y.o.  Admit date: 10/14/2013 Discharge date: 10/16/2013 Admission Diagnoses: Cholecystitis, cholelithiasis, with obstruction Discharge Diagnoses: Cholecystitis, cholelithiasis Active Problems:   Calculous cholecystitis with obstruction   Discharged Condition: good  Hospital Course: Patient is a 33 year old black female who was admitted to [facility] do to worsening right upper quadrant abdominal pain. She was found to have elevated liver enzyme tests as well as an ultrasound of the gallbladder revealed cholecystitis, cholelithiasis, and a  dilated common bile duct. An MRCP was performed which was negative for choledocholithiasis. She subsequently underwent laparoscopic cholecystectomy on 10/16/2013. She tolerated procedure well. Her postoperative course was unremarkable. Her diet was advanced at difficulty. She was discharged home on 10/16/2013 in good improving condition.  Significant Diagnostic Studies: MRCP  Treatments: surgery: Laparoscopic cholecystectomy on 10/14/2013  Discharge Exam: Blood pressure 148/78, pulse 55, temperature 97.9 F (36.6 C), temperature source Oral, resp. rate 17, height 5\' 6"  (1.676 m), weight 87.544 kg (193 lb), last menstrual period 10/06/2013, SpO2 100.00%. General appearance: alert, cooperative and no distress Resp: clear to auscultation bilaterally Cardio: regular rate and rhythm, S1, S2 normal, no murmur, click, rub or gallop GI: Soft, dressings dry and intact.  Disposition: 01-Home or Self Care     Medication List         HYDROcodone-acetaminophen 5-325 MG per tablet  Commonly known as:  NORCO/VICODIN  Take 1-2 tablets by mouth every 4 (four) hours as needed for moderate pain.     ibuprofen 200 MG tablet  Commonly known as:  ADVIL,MOTRIN  Take 200 mg by mouth every 6 (six) hours as needed (pain).     ibuprofen 800 MG tablet  Commonly  known as:  ADVIL  Take 1 tablet (800 mg total) by mouth every 8 (eight) hours as needed for moderate pain.           Follow-up Information   Follow up with Jamesetta So, MD. Schedule an appointment as soon as possible for a visit on 10/22/2013.   Specialty:  General Surgery   Contact information:   [id number] [id number] [phone]       Signed: Aviva Signs A 10/21/2013, 10:05 AM

## 2013-10-27 IMAGING — CR DG HAND COMPLETE 3+V*R*
3 series · 3 of 3 positions shown · non-contrast
Comparison: None.

CLINICAL DATA: Pain

RIGHT HAND - COMPLETE 3+ VIEW

[view not recorded (1 of 3)]
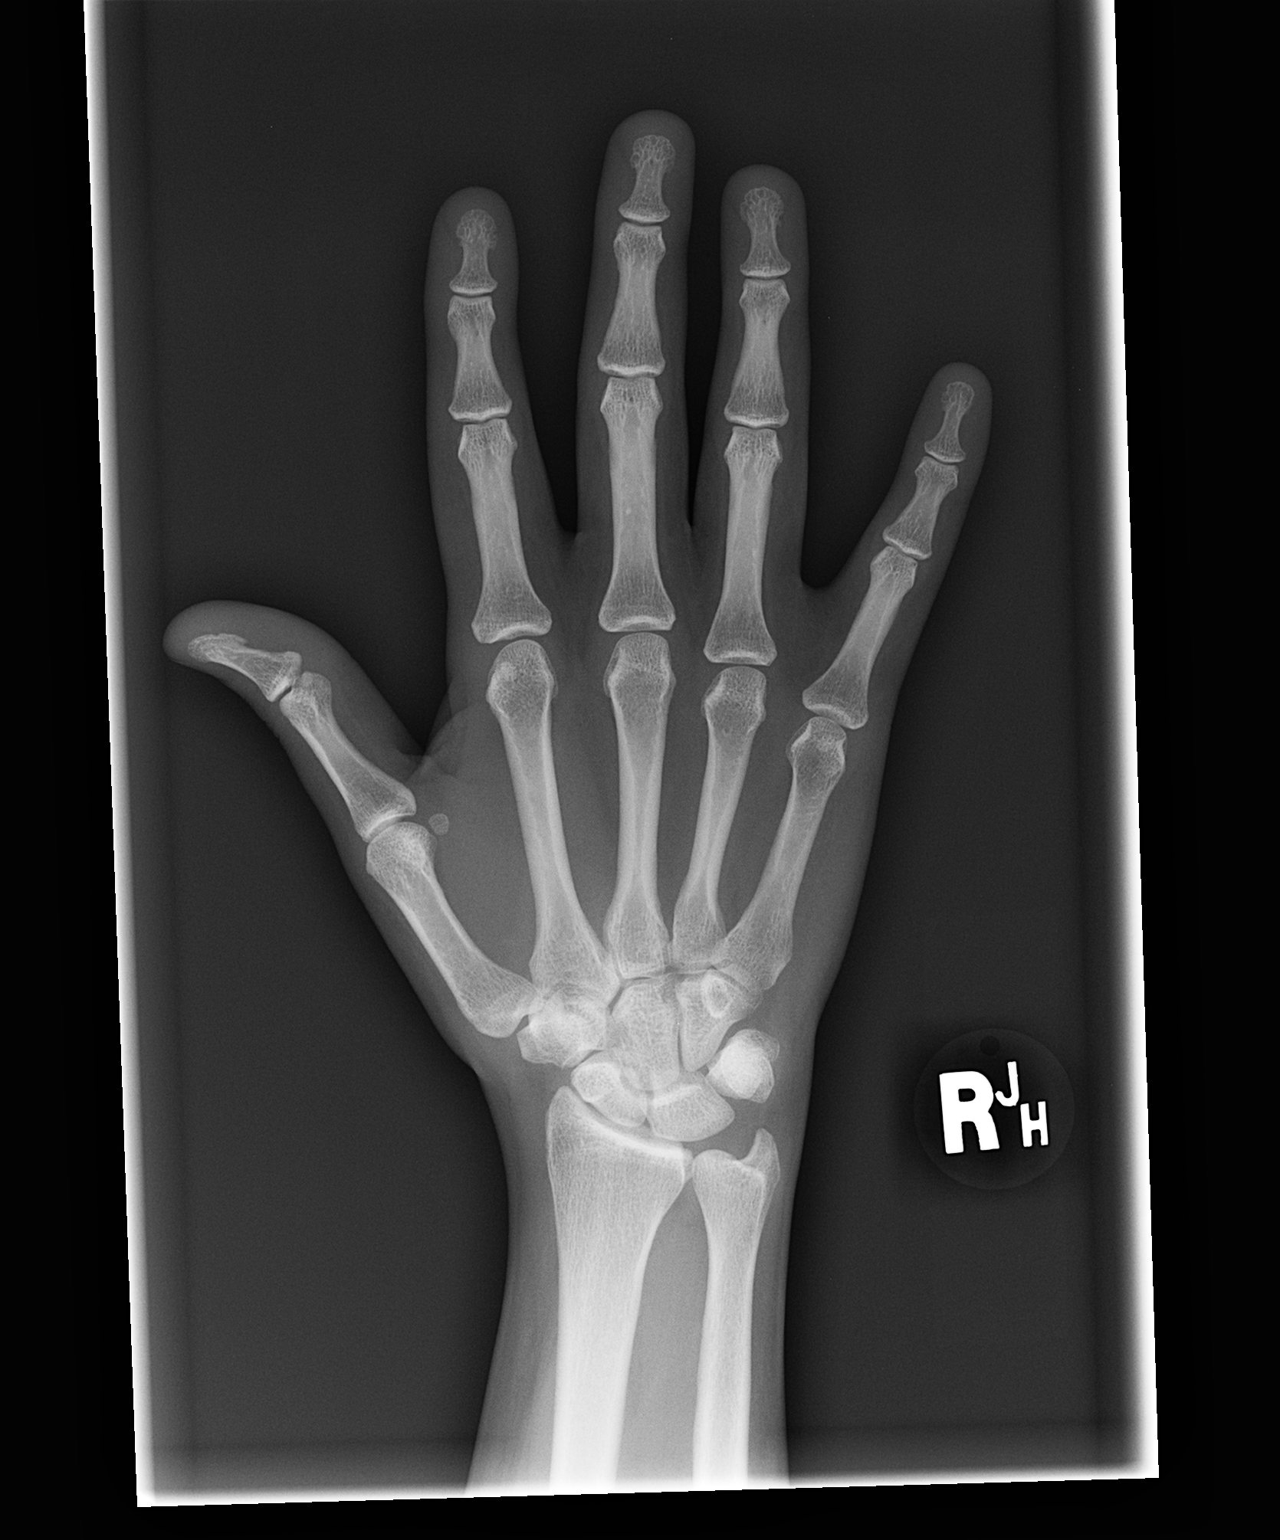

[view not recorded (2 of 3)]
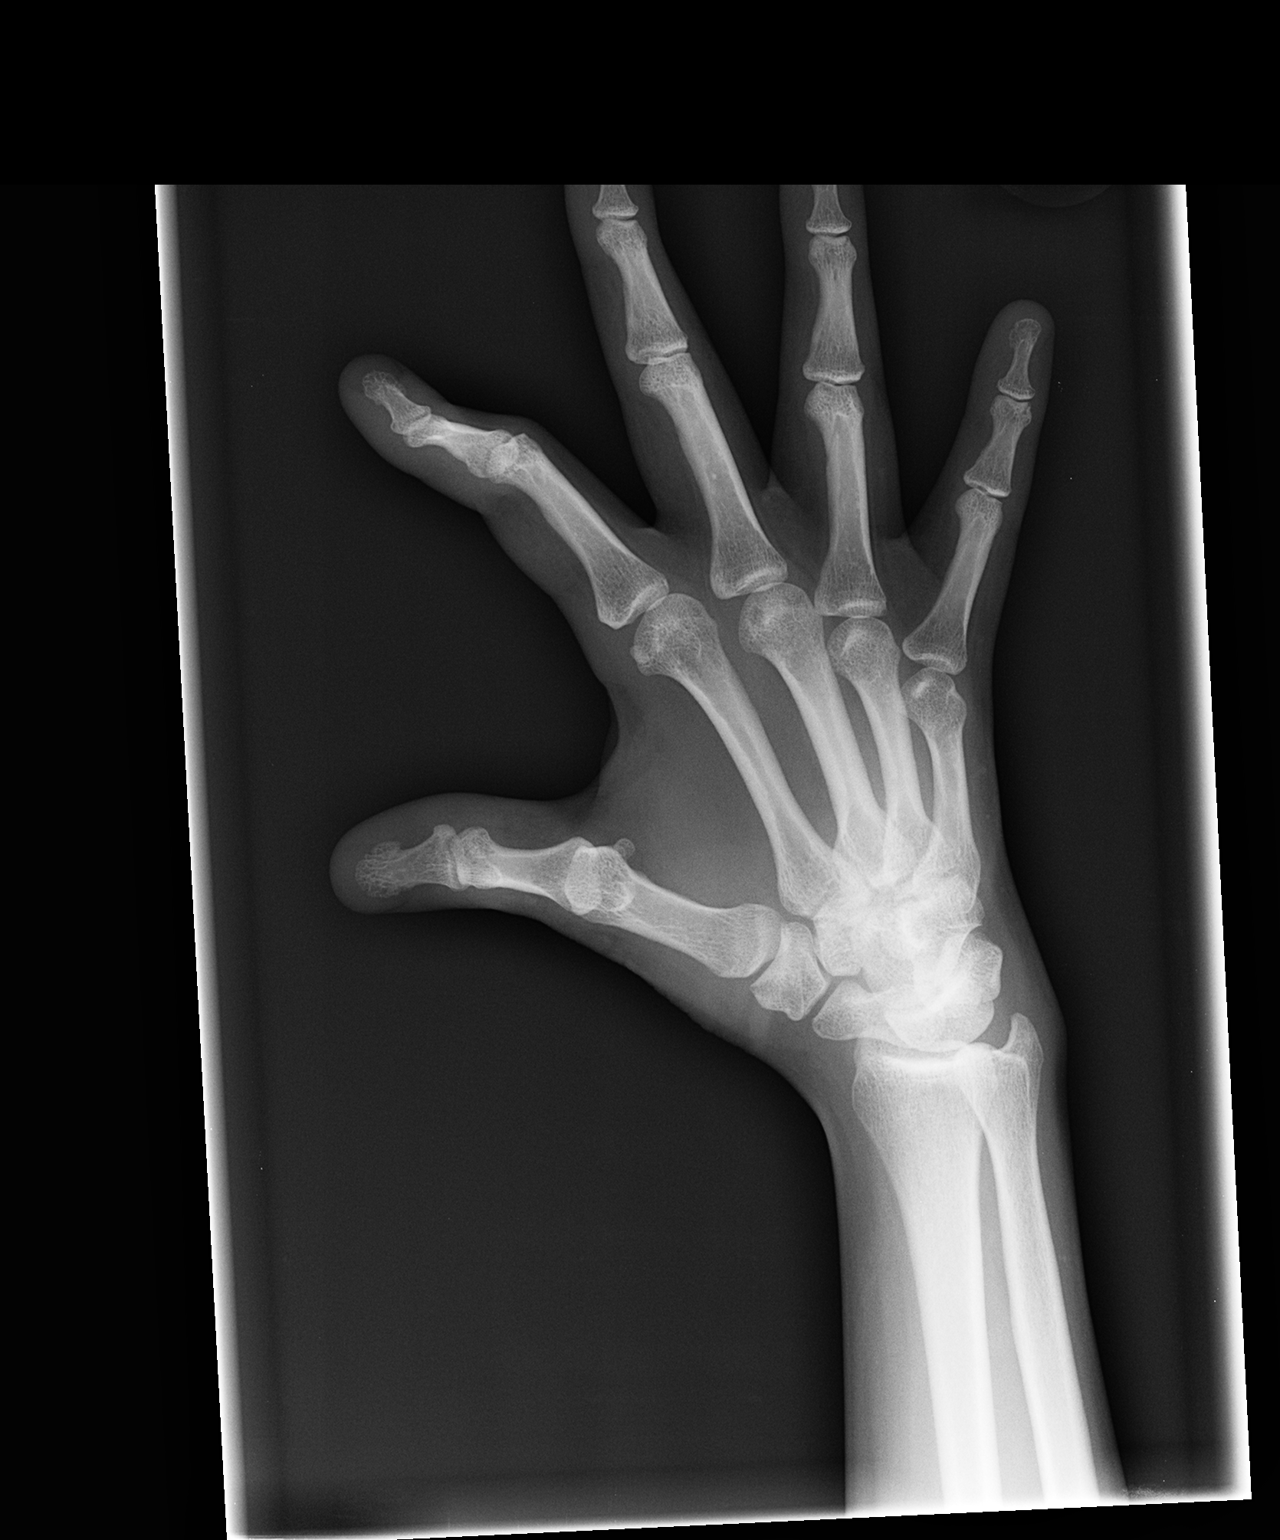

[view not recorded (3 of 3)]
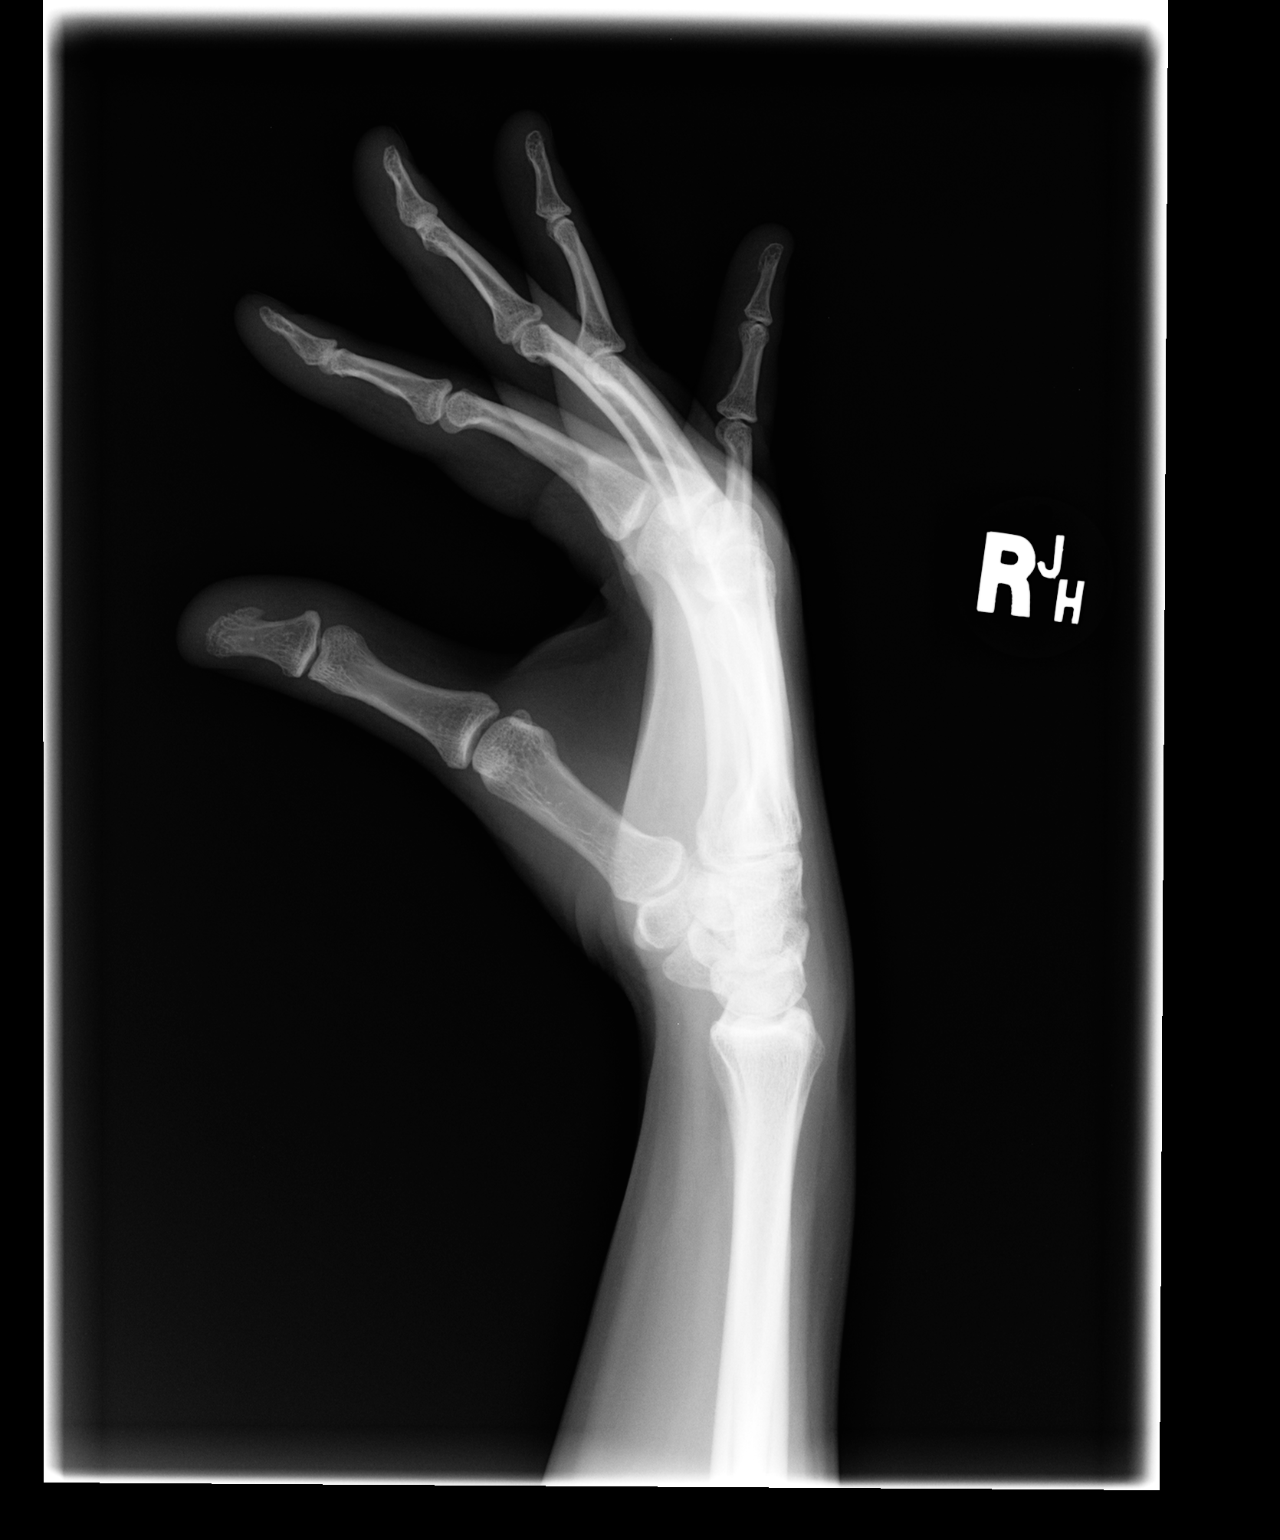

[3 of 3 positions shown; findings below may reference images not displayed]

FINDINGS: Frontal, oblique, and lateral views were obtained.
There is no fracture or dislocation.  Joint spaces appear intact.
No erosive change.
IMPRESSION: No abnormality noted.

## 2013-12-14 ENCOUNTER — Encounter (HOSPITAL_COMMUNITY): Payer: Self-pay | Admitting: General Surgery

## 2014-08-23 ENCOUNTER — Emergency Department (HOSPITAL_COMMUNITY)
Admission: EM | Admit: 2014-08-23 | Discharge: 2014-08-23 | Disposition: A | Discharge status: Home / Self Care | Payer: 59 | Attending: Emergency Medicine | Admitting: Emergency Medicine

## 2014-08-23 ENCOUNTER — Encounter (HOSPITAL_COMMUNITY): Payer: Self-pay | Admitting: *Deleted

## 2014-08-23 DIAGNOSIS — Z872 Personal history of diseases of the skin and subcutaneous tissue: Secondary | ICD-10-CM | POA: Insufficient documentation

## 2014-08-23 DIAGNOSIS — Z8719 Personal history of other diseases of the digestive system: Secondary | ICD-10-CM | POA: Insufficient documentation

## 2014-08-23 DIAGNOSIS — J45909 Unspecified asthma, uncomplicated: Secondary | ICD-10-CM | POA: Insufficient documentation

## 2014-08-23 DIAGNOSIS — I10 Essential (primary) hypertension: Secondary | ICD-10-CM | POA: Insufficient documentation

## 2014-08-23 DIAGNOSIS — J029 Acute pharyngitis, unspecified: Secondary | ICD-10-CM | POA: Insufficient documentation

## 2014-08-23 MED ORDER — MAGIC MOUTHWASH W/LIDOCAINE: 5.0000 mL | Freq: Three times a day (TID) | ORAL | Status: DC | PRN

## 2014-08-23 MED ORDER — AMOXICILLIN 500 MG PO CAPS: 500.0000 mg | ORAL_CAPSULE | Freq: Three times a day (TID) | ORAL | Status: DC

## 2014-08-23 NOTE — Discharge Instructions (Signed)

## 2014-08-23 NOTE — ED Provider Notes (Signed)
CSN: [id number]     Arrival date & time 08/23/14  1807 History  This chart was scribed for non-physician practitioner Kem Parkinson, PA-C working with Ezequiel Essex, MD by Hilda Lias, ED Scribe. This patient was seen in room APFT23/APFT23 and the patient's care was started at 7:24 PM.  Chief Complaint  Patient presents with  . Sore Throat      Patient is a 34 y.o. female presenting with pharyngitis. The history is provided by the patient. No language interpreter was used.  Sore Throat This is a new problem. The current episode started more than 2 days ago. The problem occurs constantly. The problem has been gradually worsening. Pertinent negatives include no abdominal pain, no headaches and no shortness of breath. She has tried nothing for the symptoms.     HPI Comments: Bonnie Jones is a 34 y.o. female who presents to the Emergency Department complaining of constant, worsening right-sided throat pain with associated trouble swallowing and radiating pain to the right side of her face, right ear pain, and congestion that has been present for 3 days. Pt denies taking anything to treat her symptoms and denies any new medications recently. Pt states she is not allergic to anything. Pt denies positive sick contact.   Pt also reports that her right eye has been red and itching, and states that she has had some yellow discharge and states they become crusty sometimes. Pt denies using eye drops or any other treatment to treat her symptoms.     Past Medical History  Diagnosis Date  . Hypertension   . Weight decrease   . Asthma     as child  . GERD (gastroesophageal reflux disease)   . Pregnant   . Abnormal Pap smear   . Skin infection 09/16/2012   Past Surgical History  Procedure Laterality Date  . Tonsillectomy and adenoidectomy    . Mass excision  02/02/2011    Procedure: EXCISION MASS;  Surgeon: Belva Crome, MD;  Location: [facility];  Service: General;  Laterality: Left;   Excision of LEFT UPPER BACK MASS  . Back surgery    . Tonsillectomy    . Tubal ligation N/A 08/23/2012    Procedure: POST PARTUM TUBAL LIGATION with filshie clips and removal of nevi;  Surgeon: Donnamae Jude, MD;  Location: [facility];  Service: Gynecology;  Laterality: N/A;  . Cholecystectomy N/A 10/16/2013    Procedure: LAPAROSCOPIC CHOLECYSTECTOMY;  Surgeon: Jamesetta So, MD;  Location: [facility];  Service: General;  Laterality: N/A;   Family History  Problem Relation Age of Onset  . Hypertension Mother   . Heart disease Mother   . Diabetes Mother   . Cancer Mother   . Other Son     sickle cell gene   History  Substance Use Topics  . Smoking status: Current Every Day Smoker -- 0.50 packs/day for 10 years  . Smokeless tobacco: Never Used  . Alcohol Use: 0.0 oz/week     Comment: beer occ   OB History    Gravida Para Term Preterm AB TAB SAB Ectopic Multiple Living   3 2 2  1 1    2      Review of Systems  Constitutional: Negative for fever, chills, activity change and appetite change.  HENT: Positive for congestion, ear pain, sore throat and trouble swallowing. Negative for facial swelling and voice change.   Eyes: Positive for discharge, redness and itching. Negative for pain and visual disturbance.  Respiratory:  Negative for cough and shortness of breath.   Gastrointestinal: Negative for nausea, vomiting and abdominal pain.  Musculoskeletal: Negative for arthralgias, neck pain and neck stiffness.  Skin: Negative for color change and rash.  Neurological: Negative for dizziness, facial asymmetry, speech difficulty, numbness and headaches.  Hematological: Negative for adenopathy.  All other systems reviewed and are negative.     Allergies  Review of patient's allergies indicates no known allergies.  Home Medications   Prior to Admission medications   Medication Sig Start Date End Date Taking? Authorizing Provider  HYDROcodone-acetaminophen (NORCO/VICODIN) 5-325 MG per tablet  Take 1-2 tablets by mouth every 4 (four) hours as needed for moderate pain. 10/16/13 10/16/14  Aviva Signs, MD  ibuprofen (ADVIL) 800 MG tablet Take 1 tablet (800 mg total) by mouth every 8 (eight) hours as needed for moderate pain. 10/16/13   Aviva Signs, MD  ibuprofen (ADVIL,MOTRIN) 200 MG tablet Take 200 mg by mouth every 6 (six) hours as needed (pain).     Historical Provider, MD   BP 142/93 mmHg  Pulse 75  Temp(Src) 98.7 F (37.1 C) (Oral)  Resp 20  Ht 5' 5.5" (1.664 m)  Wt 183 lb (83.008 kg)  BMI 29.98 kg/m2  SpO2 100%  LMP 08/12/2014 Physical Exam  Constitutional: She is oriented to person, place, and time. She appears well-developed and well-nourished.  HENT:  Head: Normocephalic and atraumatic.  Right Ear: Tympanic membrane and ear canal normal.  Left Ear: Tympanic membrane and ear canal normal.  Mouth/Throat: Uvula is midline and mucous membranes are normal. No oral lesions. No trismus in the jaw. No uvula swelling. Posterior oropharyngeal erythema present. No tonsillar abscesses.  Erythema of oropharynx No edema or exudate Two small ulcerations to the right soft palate    Cardiovascular: Normal rate.   Pulmonary/Chest: Effort normal.  Abdominal: She exhibits no distension.  Lymphadenopathy:    She has cervical adenopathy.  Neurological: She is alert and oriented to person, place, and time.  Skin: Skin is warm and dry.  Psychiatric: She has a normal mood and affect.  Nursing note and vitals reviewed.   ED Course  Procedures (including critical care time)  DIAGNOSTIC STUDIES: Oxygen Saturation is 100% on room air, normal by my interpretation.    COORDINATION OF CARE: 7:27 PM Discussed treatment plan with pt at bedside and pt agreed to plan.   Labs Review Labs Reviewed - No data to display  Imaging Review No results found.   EKG Interpretation None      MDM   Final diagnoses:  Pharyngitis    Pt is well appearing.  Airway patent.  Vitals stable.  No  PTA or edema of the uvula.  Sx's likely viral.  Agrees to sx tx and close f/u with PMD   I personally performed the services described in this documentation, which was scribed in my presence. The recorded information has been reviewed and is accurate.     Kem Parkinson, PA-C 08/26/14 [place], MD 08/27/14 [phone]

## 2014-08-23 NOTE — ED Notes (Signed)
Sore throat w/difficulty swallowing since Saturday.  Denies fevers, n/v/d.

## 2016-05-14 ENCOUNTER — Emergency Department (HOSPITAL_COMMUNITY)
Admission: EM | Admit: 2016-05-14 | Discharge: 2016-05-15 | Disposition: A | Discharge status: Home / Self Care | Payer: 59 | Attending: Emergency Medicine | Admitting: Emergency Medicine

## 2016-05-14 ENCOUNTER — Encounter (HOSPITAL_COMMUNITY): Payer: Self-pay

## 2016-05-14 DIAGNOSIS — R509 Fever, unspecified: Secondary | ICD-10-CM | POA: Insufficient documentation

## 2016-05-14 DIAGNOSIS — R0602 Shortness of breath: Secondary | ICD-10-CM | POA: Insufficient documentation

## 2016-05-14 DIAGNOSIS — I10 Essential (primary) hypertension: Secondary | ICD-10-CM | POA: Insufficient documentation

## 2016-05-14 DIAGNOSIS — R51 Headache: Secondary | ICD-10-CM | POA: Insufficient documentation

## 2016-05-14 DIAGNOSIS — F17200 Nicotine dependence, unspecified, uncomplicated: Secondary | ICD-10-CM | POA: Diagnosis not present

## 2016-05-14 DIAGNOSIS — R69 Illness, unspecified: Secondary | ICD-10-CM

## 2016-05-14 DIAGNOSIS — R062 Wheezing: Secondary | ICD-10-CM | POA: Insufficient documentation

## 2016-05-14 DIAGNOSIS — J111 Influenza due to unidentified influenza virus with other respiratory manifestations: Secondary | ICD-10-CM

## 2016-05-14 DIAGNOSIS — Z79899 Other long term (current) drug therapy: Secondary | ICD-10-CM | POA: Diagnosis not present

## 2016-05-14 MED ORDER — ACETAMINOPHEN 500 MG PO TABS
1000.0000 mg | ORAL_TABLET | Freq: Once | ORAL | Status: AC
  Administered 2016-05-14: 1000 mg via ORAL

## 2016-05-14 MED ORDER — ACETAMINOPHEN 500 MG PO TABS
ORAL_TABLET | ORAL | Status: AC
  Administered 2016-05-14: 1000 mg via ORAL
  Filled 2016-05-14: qty 2

## 2016-05-14 NOTE — ED Triage Notes (Signed)
Patient states that she woke up with a fever today.  That she has body aches.  Patient states that she has not taken any medication at home, she did not have any.

## 2016-05-14 NOTE — ED Provider Notes (Signed)
[facility] DEPT Provider Note   CSN: [id number] Arrival date & time: 05/14/16  2021     History   Chief Complaint Chief Complaint  Patient presents with  . Fever    HPI Bonnie Jones is a 36 y.o. female who presents to the ED with fever, cough, congestion, aching all over and flu like symptoms. The symptoms started today and have gotten worse.   The history is provided by the patient. No language interpreter was used.  Fever   This is a new problem. The current episode started 6 to 12 hours ago. The maximum temperature noted was 103 to 104 F. Associated symptoms include congestion, headaches, sore throat, muscle aches and cough. Pertinent negatives include no chest pain, no diarrhea and no vomiting. She has tried nothing for the symptoms.    Past Medical History:  Diagnosis Date  . Abnormal Pap smear   . Asthma    as child  . GERD (gastroesophageal reflux disease)   . Hypertension   . Pregnant   . Skin infection 09/16/2012  . Weight decrease     Patient Active Problem List   Diagnosis Date Noted  . Calculous cholecystitis with obstruction 10/14/2013  . De Quervain's syndrome (tenosynovitis) 11/25/2012  . Sterilization 08/23/2012  . 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 Benign essential hypertension 06/03/2012    Past Surgical History:  Procedure Laterality Date  . BACK SURGERY    . CHOLECYSTECTOMY N/A 10/16/2013   Procedure: LAPAROSCOPIC CHOLECYSTECTOMY;  Surgeon: Jamesetta So, MD;  Location: [facility];  Service: General;  Laterality: N/A;  . MASS EXCISION  02/02/2011   Procedure: EXCISION MASS;  Surgeon: Belva Crome, MD;  Location: [facility];  Service: General;  Laterality: Left;  Excision of LEFT UPPER BACK MASS  . TONSILLECTOMY    . TONSILLECTOMY AND ADENOIDECTOMY    . TUBAL LIGATION N/A 08/23/2012   Procedure: POST PARTUM TUBAL LIGATION with filshie clips and removal of nevi;  Surgeon: Donnamae Jude, MD;  Location: [facility];  Service: Gynecology;   Laterality: N/A;    OB History    Gravida Para Term Preterm AB Living   3 2 2   1 2    SAB TAB Ectopic Multiple Live Births     1     2       Home Medications    Prior to Admission medications   Medication Sig Start Date End Date Taking? Authorizing Provider  Alum & Mag Hydroxide-Simeth (MAGIC MOUTHWASH W/LIDOCAINE) SOLN Take 5 mLs by mouth 3 (three) times daily as needed for mouth pain. Swish and spit, do not swallow 08/23/14   Tammy Triplett, PA-C  amoxicillin (AMOXIL) 500 MG capsule Take 1 capsule (500 mg total) by mouth 3 (three) times daily. For 10 days 08/23/14   Tammy Triplett, PA-C  benzonatate (TESSALON) 100 MG capsule Take 1 capsule (100 mg total) by mouth every 8 (eight) hours. 05/15/16   Hope Bunnie Pion, NP  ibuprofen (ADVIL) 800 MG tablet Take 1 tablet (800 mg total) by mouth every 8 (eight) hours as needed for moderate pain. 10/16/13   Aviva Signs, MD  ibuprofen (ADVIL,MOTRIN) 200 MG tablet Take 200 mg by mouth every 6 (six) hours as needed (pain).     Historical Provider, MD  oseltamivir (TAMIFLU) 75 MG capsule Take 1 capsule (75 mg total) by mouth every 12 (twelve) hours. 05/15/16   [place], NP    Family History Family History  Problem Relation  Age of Onset  . Hypertension Mother   . Heart disease Mother   . Diabetes Mother   . Cancer Mother   . Other Son     sickle cell gene    Social History Social History  Substance Use Topics  . Smoking status: Current Every Day Smoker    Packs/day: 0.50    Years: 10.00  . Smokeless tobacco: Never Used  . Alcohol use 0.0 oz/week     Comment: beer occ     Allergies   Patient has no known allergies.   Review of Systems Review of Systems  Constitutional: Positive for chills and fever.  HENT: Positive for congestion and sore throat. Negative for ear pain.   Eyes: Negative for pain, redness and itching.  Respiratory: Positive for cough, shortness of breath and wheezing.   Cardiovascular: Negative for chest pain.    Gastrointestinal: Negative for abdominal pain, diarrhea, nausea and vomiting.  Genitourinary: Negative for dysuria, frequency and urgency.  Musculoskeletal: Positive for back pain and myalgias.  Skin: Negative for rash.  Neurological: Positive for light-headedness and headaches. Negative for syncope.  Hematological: Positive for adenopathy.  Psychiatric/Behavioral: Negative for confusion.     Physical Exam Updated Vital Signs BP (!) 155/80 (BP Location: Right Arm)   Pulse (!) 105   Temp (!) 102.3 F (39.1 C) (Oral)   Resp 16   Ht 5' 5.5" (1.664 m)   Wt 92.1 kg   LMP 05/09/2016   SpO2 99%   BMI 33.27 kg/m   Physical Exam  Constitutional: She is oriented to person, place, and time. She appears well-developed and well-nourished. No distress.  HENT:  Head: Normocephalic and atraumatic.  Right Ear: Tympanic membrane normal.  Left Ear: Tympanic membrane normal.  Mouth/Throat: Uvula is midline and mucous membranes are normal. Posterior oropharyngeal erythema present.  Eyes: EOM are normal.  Neck: Normal range of motion. Neck supple.  Cardiovascular: Regular rhythm.  Tachycardia present.   Pulmonary/Chest: Effort normal. She has no wheezes. She has no rales.  Abdominal: Soft. Bowel sounds are normal. There is no tenderness.  Musculoskeletal: Normal range of motion.  Lymphadenopathy:    She has cervical adenopathy.  Neurological: She is alert and oriented to person, place, and time. No cranial nerve deficit.  Skin: Skin is warm and dry.  Psychiatric: She has a normal mood and affect. Her behavior is normal.  Nursing note and vitals reviewed.    ED Treatments / Results  Labs (all labs ordered are listed, but only abnormal results are displayed) Labs Reviewed  RAPID STREP SCREEN (NOT AT [facility])  CULTURE, GROUP A STREP [facility])    Radiology Dg Chest 2 View  Result Date: 05/15/2016 CLINICAL DATA:  Cough fever and midchest pain since 05/14/2016. EXAM: CHEST  2 VIEW COMPARISON:   01/23/2011 FINDINGS: The heart size and mediastinal contours are within normal limits. Both lungs are clear. The visualized skeletal structures are unremarkable. IMPRESSION: No active cardiopulmonary disease. Electronically Signed   By: Andreas Newport M.D.   On: 05/15/2016 02:33    Procedures Procedures (including critical care time)  Medications Ordered in ED Medications  oseltamivir (TAMIFLU) capsule 75 mg (not administered)  acetaminophen (TYLENOL) tablet 1,000 mg (1,000 mg Oral Given 05/14/16 2152)     Initial Impression / Assessment and Plan / ED Course  I have reviewed the triage vital signs and the nursing notes.  Pertinent labs & imaging results that were available during my care of the patient were reviewed by me  and considered in my medical decision making (see chart for details).   Final Clinical Impressions(s) / ED Diagnoses  SUBJECTIVE:  Bonnie Jones is a 36 y.o. female who present complaining of flu-like symptoms: fevers, chills, myalgias, congestion, sore throat and cough for 12 hours. Denies dyspnea or wheezing.  OBJECTIVE: Appears moderately ill but not toxic; temperature as noted in vitals. Ears normal. Throat and pharynx with erythema but negative strep screen, no tonsillar abscess. Neck supple. Sinuses non tender. The chest is clear.  ASSESSMENT: Flu like symptoms  PLAN: Symptomatic therapy suggested: rest, increase fluids, gargle prn for sore throat, use mist of vaporizer prn and call prn if symptoms persist or worsen. Call or return to clinic prn if these symptoms worsen or fail to improve as anticipated. Will start Tamiflu and Tessalon  Final diagnoses:  Influenza-like illness    New Prescriptions New Prescriptions   BENZONATATE (TESSALON) 100 MG CAPSULE    Take 1 capsule (100 mg total) by mouth every 8 (eight) hours.   OSELTAMIVIR (TAMIFLU) 75 MG CAPSULE    Take 1 capsule (75 mg total) by mouth every 12 (twelve) hours.     [address] [place],  [place] 05/15/16 [place], MD 05/15/16 [phone]

## 2016-05-15 ENCOUNTER — Emergency Department (HOSPITAL_COMMUNITY): Payer: 59

## 2016-05-15 LAB — RAPID STREP SCREEN (MED CTR MEBANE ONLY): STREPTOCOCCUS, GROUP A SCREEN (DIRECT): NEGATIVE

## 2016-05-15 MED ORDER — BENZONATATE 100 MG PO CAPS: 100.0000 mg | ORAL_CAPSULE | Freq: Three times a day (TID) | ORAL | 0 refills | Status: DC

## 2016-05-15 MED ORDER — OSELTAMIVIR PHOSPHATE 75 MG PO CAPS
75.0000 mg | ORAL_CAPSULE | Freq: Once | ORAL | Status: AC
  Administered 2016-05-15: 75 mg via ORAL
  Filled 2016-05-15: qty 1

## 2016-05-15 MED ORDER — OSELTAMIVIR PHOSPHATE 75 MG PO CAPS: 75.0000 mg | ORAL_CAPSULE | Freq: Two times a day (BID) | ORAL | 0 refills | Status: DC

## 2016-05-15 NOTE — Discharge Instructions (Signed)
Take tylenol and ibuprofen as needed for pain and fever. Follow up with your doctor. Return here for worsening symptoms.

## 2016-05-18 LAB — CULTURE, GROUP A STREP (THRC)

## 2017-02-21 ENCOUNTER — Encounter (HOSPITAL_COMMUNITY): Payer: Self-pay | Admitting: Emergency Medicine

## 2017-02-21 ENCOUNTER — Emergency Department (HOSPITAL_COMMUNITY)
Admission: EM | Admit: 2017-02-21 | Discharge: 2017-02-21 | Disposition: A | Discharge status: Home / Self Care | Payer: 59 | Attending: Emergency Medicine | Admitting: Emergency Medicine

## 2017-02-21 ENCOUNTER — Other Ambulatory Visit: Payer: Self-pay

## 2017-02-21 DIAGNOSIS — S46912A Strain of unspecified muscle, fascia and tendon at shoulder and upper arm level, left arm, initial encounter: Secondary | ICD-10-CM | POA: Diagnosis not present

## 2017-02-21 DIAGNOSIS — J45909 Unspecified asthma, uncomplicated: Secondary | ICD-10-CM | POA: Diagnosis not present

## 2017-02-21 DIAGNOSIS — X509XXA Other and unspecified overexertion or strenuous movements or postures, initial encounter: Secondary | ICD-10-CM | POA: Insufficient documentation

## 2017-02-21 DIAGNOSIS — F17210 Nicotine dependence, cigarettes, uncomplicated: Secondary | ICD-10-CM | POA: Diagnosis not present

## 2017-02-21 DIAGNOSIS — Y9389 Activity, other specified: Secondary | ICD-10-CM | POA: Diagnosis not present

## 2017-02-21 DIAGNOSIS — Y990 Civilian activity done for income or pay: Secondary | ICD-10-CM | POA: Diagnosis not present

## 2017-02-21 DIAGNOSIS — I10 Essential (primary) hypertension: Secondary | ICD-10-CM | POA: Insufficient documentation

## 2017-02-21 DIAGNOSIS — Y92009 Unspecified place in unspecified non-institutional (private) residence as the place of occurrence of the external cause: Secondary | ICD-10-CM | POA: Diagnosis not present

## 2017-02-21 DIAGNOSIS — S4992XA Unspecified injury of left shoulder and upper arm, initial encounter: Secondary | ICD-10-CM | POA: Diagnosis present

## 2017-02-21 MED ORDER — DICLOFENAC SODIUM 75 MG PO TBEC: 75.0000 mg | DELAYED_RELEASE_TABLET | Freq: Two times a day (BID) | ORAL | 0 refills | Status: DC

## 2017-02-21 MED ORDER — CYCLOBENZAPRINE HCL 10 MG PO TABS: 10.0000 mg | ORAL_TABLET | Freq: Three times a day (TID) | ORAL | 0 refills | Status: DC | PRN

## 2017-02-21 NOTE — Discharge Instructions (Signed)
Apply ice packs on and off to your shoulder.  Follow-up with your primary doctor or call Dr. Ruthe Mannan office to arrange a follow-up appointment in 1 week if not improving

## 2017-02-21 NOTE — ED Provider Notes (Signed)
[facility] EMERGENCY DEPARTMENT Provider Note   CSN: [id number] Arrival date & time: 02/21/17  1325     History   Chief Complaint Chief Complaint  Patient presents with  . Shoulder Pain    HPI Bonnie Jones is a 37 y.o. female.  HPI   Bonnie Jones is a 37 y.o. female who presents to the Emergency Department complaining of left posterior shoulder pain for 2 weeks.  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  Complains of sharp, throbbing pain across the top of her shoulder and around the top of her shoulder blade.  Pain with left arm movement.  She denies swelling, rash, neck pain, chest pain, numbness or weakness or pain radiating into the left arm.  She has not tried any medications or therapies prior to ER arrival   Past Medical History:  Diagnosis Date  . Abnormal Pap smear   . Asthma    as child  . GERD (gastroesophageal reflux disease)   . Hypertension   . Pregnant   . Skin infection 09/16/2012  . Weight decrease     Patient Active Problem List   Diagnosis Date Noted  . Calculous cholecystitis with obstruction 10/14/2013  . De Quervain's syndrome (tenosynovitis) 11/25/2012  . Sterilization 08/23/2012  . 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 Benign essential hypertension 06/03/2012    Past Surgical History:  Procedure Laterality Date  . BACK SURGERY    . CHOLECYSTECTOMY N/A 10/16/2013   Procedure: LAPAROSCOPIC CHOLECYSTECTOMY;  Surgeon: Jamesetta So, MD;  Location: [facility];  Service: General;  Laterality: N/A;  . MASS EXCISION  02/02/2011   Procedure: EXCISION MASS;  Surgeon: Belva Crome, MD;  Location: [facility];  Service: General;  Laterality: Left;  Excision of LEFT UPPER BACK MASS  . TONSILLECTOMY    . TONSILLECTOMY AND ADENOIDECTOMY    . TUBAL LIGATION N/A 08/23/2012   Procedure: POST PARTUM TUBAL LIGATION with filshie clips and removal of nevi;  Surgeon: Donnamae Jude, MD;  Location: [facility];  Service:  Gynecology;  Laterality: N/A;    OB History    Gravida Para Term Preterm AB Living   3 2 2   1 2    SAB TAB Ectopic Multiple Live Births     1     2       Home Medications    Prior to Admission medications   Not on File    Family History Family History  Problem Relation Age of Onset  . Hypertension Mother   . Heart disease Mother   . Diabetes Mother   . Cancer Mother   . Other Son        sickle cell gene    Social History Social History   Tobacco Use  . Smoking status: Current Every Day Smoker    Packs/day: 0.50    Years: 10.00    Pack years: 5.00    Types: Cigarettes  . Smokeless tobacco: Never Used  Substance Use Topics  . Alcohol use: Yes    Alcohol/week: 0.0 oz    Comment: beer occ  . Drug use: No     Allergies   Patient has no known allergies.   Review of Systems Review of Systems  Constitutional: Negative for chills and fever.  Respiratory: Negative for chest tightness.   Cardiovascular: Negative for chest pain.  Musculoskeletal: Positive for arthralgias. Negative for joint swelling and neck pain.  Skin:  Negative for color change and wound.  Neurological: Negative for dizziness, weakness, numbness and headaches.  All other systems reviewed and are negative.    Physical Exam Updated Vital Signs BP (!) 152/83 (BP Location: Right Arm)   Pulse 78   Temp 98.3 F (36.8 C) (Oral)   Resp 18   Ht 5' 5.5" (1.664 m)   Wt 94.8 kg (209 lb)   LMP 01/26/2017   SpO2 100%   BMI 34.25 kg/m   Physical Exam  Constitutional: She is oriented to person, place, and time. She appears well-developed and well-nourished. No distress.  HENT:  Head: Normocephalic and atraumatic.  Neck: Normal range of motion. Neck supple. No thyromegaly present.  Cardiovascular: Normal rate, regular rhythm and intact distal pulses.  No murmur heard. Radial pulses strong and palpable bilaterally  Pulmonary/Chest: Effort normal and breath sounds normal. No respiratory  distress. She exhibits no tenderness.  Musculoskeletal: She exhibits tenderness. She exhibits no edema.       Back:  Focal tenderness of the left trapezius and upper rhomboid muscles.  No tenderness to the left AC joint.  No cervical tenderness  Lymphadenopathy:    She has no cervical adenopathy.  Neurological: She is alert and oriented to person, place, and time. She has normal strength. No sensory deficit. She exhibits normal muscle tone. Coordination normal.  Reflex Scores:      Tricep reflexes are 1+ on the right side and 2+ on the left side.      Bicep reflexes are 1+ on the right side and 2+ on the left side. Skin: Skin is warm. Capillary refill takes less than 2 seconds. No rash noted.  Nursing note and vitals reviewed.    ED Treatments / Results  Labs (all labs ordered are listed, but only abnormal results are displayed) Labs Reviewed - No data to display  EKG  EKG Interpretation None       Radiology No results found.  Procedures Procedures (including critical care time)  Medications Ordered in ED Medications - No data to display   Initial Impression / Assessment and Plan / ED Course  I have reviewed the triage vital signs and the nursing notes.  Pertinent labs & imaging results that were available during my care of the patient were reviewed by me and considered in my medical decision making (see chart for details).     Patient with focal tenderness of the musculature of the left shoulder and upper back.  No history of trauma or known mechanism of injury.  Neurovascularly intact.  Symptoms are likely musculoskeletal.  Patient agrees to treatment plan of ice and nonsteroidal for pain.  No indication for imaging at this time.  Referral information given for local orthopedics patient appears safe for discharge home  Final Clinical Impressions(s) / ED Diagnoses   Final diagnoses:  Strain of left shoulder, initial encounter    ED Discharge Orders    None        Bufford Lope 02/21/17 2040    Virgel Manifold, MD 02/22/17 [phone]

## 2017-02-21 NOTE — ED Triage Notes (Signed)
Patient c/o left arm pain that started a week ago with no known injury. Patient states pain is progressively getting worse. Pain is worse with movement and difficulty rasing arm. Pain radiates from scapula to collar bone. Denies taking anything for pain.

## 2017-11-22 ENCOUNTER — Other Ambulatory Visit: Payer: Self-pay

## 2017-11-22 ENCOUNTER — Encounter (HOSPITAL_COMMUNITY): Payer: Self-pay

## 2017-11-22 ENCOUNTER — Emergency Department (HOSPITAL_COMMUNITY)
Admission: EM | Admit: 2017-11-22 | Discharge: 2017-11-23 | Disposition: A | Discharge status: Home / Self Care | Payer: 59 | Attending: Emergency Medicine | Admitting: Emergency Medicine

## 2017-11-22 DIAGNOSIS — Z79899 Other long term (current) drug therapy: Secondary | ICD-10-CM | POA: Diagnosis not present

## 2017-11-22 DIAGNOSIS — J45909 Unspecified asthma, uncomplicated: Secondary | ICD-10-CM | POA: Diagnosis not present

## 2017-11-22 DIAGNOSIS — R0989 Other specified symptoms and signs involving the circulatory and respiratory systems: Secondary | ICD-10-CM | POA: Insufficient documentation

## 2017-11-22 DIAGNOSIS — I10 Essential (primary) hypertension: Secondary | ICD-10-CM | POA: Diagnosis not present

## 2017-11-22 DIAGNOSIS — F17210 Nicotine dependence, cigarettes, uncomplicated: Secondary | ICD-10-CM | POA: Insufficient documentation

## 2017-11-22 NOTE — ED Triage Notes (Signed)
Pt reports 2 day history of the following....back pain, abd pain, "thick feeling in her throat"  Hard to swallow at times,  Pt denies sore throat or cough.

## 2017-11-23 ENCOUNTER — Emergency Department (HOSPITAL_COMMUNITY): Payer: 59

## 2017-11-23 LAB — URINALYSIS, ROUTINE W REFLEX MICROSCOPIC
Bilirubin Urine: NEGATIVE
Glucose, UA: NEGATIVE mg/dL
HGB URINE DIPSTICK: NEGATIVE
Ketones, ur: NEGATIVE mg/dL
LEUKOCYTES UA: NEGATIVE
Nitrite: NEGATIVE
Protein, ur: NEGATIVE mg/dL
SPECIFIC GRAVITY, URINE: 1.02 (ref 1.005–1.030)
pH: 6 (ref 5.0–8.0)

## 2017-11-23 LAB — COMPREHENSIVE METABOLIC PANEL
ALBUMIN: 3.9 g/dL (ref 3.5–5.0)
ALT: 33 U/L (ref 0–44)
AST: 29 U/L (ref 15–41)
Alkaline Phosphatase: 81 U/L (ref 38–126)
Anion gap: 7 (ref 5–15)
BUN: 16 mg/dL (ref 6–20)
CHLORIDE: 108 mmol/L (ref 98–111)
CO2: 24 mmol/L (ref 22–32)
Calcium: 8.6 mg/dL — ABNORMAL LOW (ref 8.9–10.3)
Creatinine, Ser: 0.8 mg/dL (ref 0.44–1.00)
GFR calc Af Amer: >60 mL/min (ref >60)
Glucose, Bld: 107 mg/dL — ABNORMAL HIGH (ref 70–99)
Potassium: 3.5 mmol/L (ref 3.5–5.1)
Sodium: 139 mmol/L (ref 135–145)
Total Bilirubin: 0.4 mg/dL (ref 0.3–1.2)
Total Protein: 7.4 g/dL (ref 6.5–8.1)

## 2017-11-23 LAB — CBC WITH DIFFERENTIAL/PLATELET
ABS IMMATURE GRANULOCYTES: 0.01 10*3/uL (ref 0.00–0.07)
BASOS ABS: 0 10*3/uL (ref 0.0–0.1)
BASOS PCT: 1 %
EOS ABS: 0.1 10*3/uL (ref 0.0–0.5)
Eosinophils Relative: 2 %
HCT: 34.1 % — ABNORMAL LOW (ref 36.0–46.0)
Hemoglobin: 10.6 g/dL — ABNORMAL LOW (ref 12.0–15.0)
IMMATURE GRANULOCYTES: 0 %
Lymphocytes Relative: 44 %
Lymphs Abs: 3.5 10*3/uL (ref 0.7–4.0)
MCH: 25.6 pg — ABNORMAL LOW (ref 26.0–34.0)
MCHC: 31.1 g/dL (ref 30.0–36.0)
MCV: 82.4 fL (ref 80.0–100.0)
MONOS PCT: 7 %
Monocytes Absolute: 0.6 10*3/uL (ref 0.1–1.0)
NEUTROS ABS: 3.7 10*3/uL (ref 1.7–7.7)
NEUTROS PCT: 46 %
NRBC: 0 % (ref 0.0–0.2)
PLATELETS: 258 10*3/uL (ref 150–400)
RBC: 4.14 MIL/uL (ref 3.87–5.11)
RDW: 14.9 % (ref 11.5–15.5)
WBC: 7.9 10*3/uL (ref 4.0–10.5)

## 2017-11-23 LAB — PREGNANCY, URINE: Preg Test, Ur: NEGATIVE

## 2017-11-23 LAB — GROUP A STREP BY PCR: Group A Strep by PCR: NOT DETECTED

## 2017-11-23 LAB — TROPONIN I: Troponin I: <0.03 ng/mL (ref <0.03)

## 2017-11-23 LAB — LIPASE, BLOOD: LIPASE: 33 U/L (ref 11–51)

## 2017-11-23 MED ORDER — GI COCKTAIL ~~LOC~~
30.0000 mL | Freq: Once | ORAL | Status: AC
Start: 2017-11-23 — End: 2017-11-23
  Administered 2017-11-23: 30 mL via ORAL
  Filled 2017-11-23: qty 30

## 2017-11-23 MED ORDER — OMEPRAZOLE 20 MG PO CPDR: 20.0000 mg | DELAYED_RELEASE_CAPSULE | Freq: Every day | ORAL | 0 refills | Status: DC

## 2017-11-23 NOTE — Discharge Instructions (Addendum)
The exam of your throat is normal.  There is no evidence of any swelling of your airway or your throat.  Start the stomach medication to see if this helps.  Follow-up with your doctor.  Return to the ED if you develop difficulty breathing, difficulty swallowing, chest pain or other concerns.

## 2017-11-23 NOTE — ED Provider Notes (Signed)
[facility] EMERGENCY DEPARTMENT Provider Note   CSN: [id number] Arrival date & time: 11/22/17  2325     History   Chief Complaint Chief Complaint  Patient presents with  . multiple complaints    HPI Bonnie Jones is a 37 y.o. female.  Patient with history of obesity and acid reflux and hypertension presenting with multiple complaints.  States over the past 2 days she is felt a "thick feeling in my throat" and feels like something might be stuck there.  She denies any throat pain or pain with swallowing.  She reports that feels tight and full and feels like something might be stuck there.  She said no drooling or vomiting.  No chest pain or shortness of breath.  She is also been having left-sided abdominal and back cramping for the past 2 days that feels similar to menstrual cramps.  This pain is been pretty steady over the past 2 days.  No abnormal vaginal bleeding or discharge.  No pain with urination or blood in the urine.  Previous cholecystectomy and tubal ligation.  The history is provided by the patient.    Past Medical History:  Diagnosis Date  . Abnormal Pap smear   . Asthma    as child  . GERD (gastroesophageal reflux disease)   . Hypertension   . Pregnant   . Skin infection 09/16/2012  . Weight decrease     Patient Active Problem List   Diagnosis Date Noted  . Calculous cholecystitis with obstruction 10/14/2013  . De Quervain's syndrome (tenosynovitis) 11/25/2012  . Sterilization 08/23/2012  . 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 Benign essential hypertension 06/03/2012    Past Surgical History:  Procedure Laterality Date  . BACK SURGERY    . CHOLECYSTECTOMY N/A 10/16/2013   Procedure: LAPAROSCOPIC CHOLECYSTECTOMY;  Surgeon: Jamesetta So, MD;  Location: [facility];  Service: General;  Laterality: N/A;  . MASS EXCISION  02/02/2011   Procedure: EXCISION MASS;  Surgeon: Belva Crome, MD;  Location: [place];  Service: General;  Laterality: Left;   Excision of LEFT UPPER BACK MASS  . TONSILLECTOMY    . TONSILLECTOMY AND ADENOIDECTOMY    . TUBAL LIGATION N/A 08/23/2012   Procedure: POST PARTUM TUBAL LIGATION with filshie clips and removal of nevi;  Surgeon: Donnamae Jude, MD;  Location: [facility];  Service: Gynecology;  Laterality: N/A;     OB History    Gravida  3   Para  2   Term  2   Preterm      AB  1   Living  2     SAB      TAB  1   Ectopic      Multiple      Live Births  2            Home Medications    Prior to Admission medications   Medication Sig Start Date End Date Taking? Authorizing Provider  cyclobenzaprine (FLEXERIL) 10 MG tablet Take 1 tablet (10 mg total) by mouth 3 (three) times daily as needed. 02/21/17   Triplett, Tammy, PA-C  diclofenac (VOLTAREN) 75 MG EC tablet Take 1 tablet (75 mg total) by mouth 2 (two) times daily. Take with food 02/21/17   Kem Parkinson, PA-C    Family History Family History  Problem Relation Age of Onset  . Hypertension Mother   . Heart disease Mother   . Diabetes Mother   . Cancer Mother   .  Other Son        sickle cell gene    Social History Social History   Tobacco Use  . Smoking status: Current Every Day Smoker    Packs/day: 0.50    Years: 10.00    Pack years: 5.00    Types: Cigarettes  . Smokeless tobacco: Never Used  Substance Use Topics  . Alcohol use: Yes    Comment: beer occ  . Drug use: No     Allergies   Patient has no known allergies.   Review of Systems Review of Systems  Constitutional: Negative for activity change, appetite change, fatigue and fever.  HENT: Positive for sore throat, trouble swallowing and voice change. Negative for congestion and rhinorrhea.   Eyes: Negative for visual disturbance.  Respiratory: Negative for cough, chest tightness and shortness of breath.   Cardiovascular: Negative for chest pain.  Gastrointestinal: Positive for abdominal pain. Negative for nausea and vomiting.  Genitourinary: Negative  for dysuria, hematuria, vaginal bleeding and vaginal discharge.  Musculoskeletal: Positive for arthralgias, back pain and myalgias.  Skin: Negative for rash.  Neurological: Negative for weakness, light-headedness, numbness and headaches.   all other systems are negative except as noted in the HPI and PMH.     Physical Exam Updated Vital Signs BP (!) 141/65 (BP Location: Right Arm)   Pulse 61   Temp 98 F (36.7 C) (Oral)   Resp 15   Ht 5' 5.5" (1.664 m)   Wt 94.8 kg   LMP 11/14/2017 Comment: tubal ligation  SpO2 100%   BMI 34.25 kg/m   Physical Exam  Constitutional: She is oriented to person, place, and time. She appears well-developed and well-nourished. No distress.  HENT:  Head: Normocephalic and atraumatic.  Mouth/Throat: Oropharynx is clear and moist. No oropharyngeal exudate.  Tonsils absent, uvula midline, floor mouth is soft, tongue piercing in place. No asymmetry.  Controlling secretions.  Eyes: Pupils are equal, round, and reactive to light. Conjunctivae and EOM are normal.  Neck: Normal range of motion. Neck supple. No tracheal deviation present.  No meningismus.  Cardiovascular: Normal rate, regular rhythm, normal heart sounds and intact distal pulses.  No murmur heard. Pulmonary/Chest: Effort normal and breath sounds normal. No respiratory distress. She exhibits no tenderness.  Abdominal: Soft. There is tenderness. There is no rebound and no guarding.  Mild left-sided tenderness, no guarding or rebound  Musculoskeletal: Normal range of motion. She exhibits no edema or tenderness.  Lymphadenopathy:    She has no cervical adenopathy.  Neurological: She is alert and oriented to person, place, and time. No cranial nerve deficit. She exhibits normal muscle tone. Coordination normal.  No ataxia on finger to nose bilaterally. No pronator drift. 5/5 strength throughout. CN 2-12 intact.Equal grip strength. Sensation intact.   Skin: Skin is warm.  Psychiatric: She has a  normal mood and affect. Her behavior is normal.  Nursing note and vitals reviewed.    ED Treatments / Results  Labs (all labs ordered are listed, but only abnormal results are displayed) Labs Reviewed  URINALYSIS, ROUTINE W REFLEX MICROSCOPIC - Abnormal; Notable for the following components:      Result Value   APPearance HAZY (*)    All other components within normal limits  CBC WITH DIFFERENTIAL/PLATELET - Abnormal; Notable for the following components:   Hemoglobin 10.6 (*)    HCT 34.1 (*)    MCH 25.6 (*)    All other components within normal limits  COMPREHENSIVE METABOLIC PANEL - Abnormal; Notable  for the following components:   Glucose, Bld 107 (*)    Calcium 8.6 (*)    All other components within normal limits  GROUP A STREP BY PCR  PREGNANCY, URINE  LIPASE, BLOOD  TROPONIN I    EKG EKG Interpretation  Date/Time:  Saturday November 23 2017 00:41:55 EDT Ventricular Rate:  65 PR Interval:    QRS Duration: 96 QT Interval:  457 QTC Calculation: 476 R Axis:   59 Text Interpretation:  Sinus rhythm Multiple ventricular premature complexes Low voltage, precordial leads No significant change was found Confirmed by Ezequiel Essex [phone]) on 11/23/2017 12:50:01 AM   Radiology Dg Neck Soft Tissue  Result Date: 11/23/2017 CLINICAL DATA:  Feels like something stuck in throat for 2 days. EXAM: NECK SOFT TISSUES - 1+ VIEW COMPARISON:  CT neck 03/22/2007 FINDINGS: There is no evidence of retropharyngeal soft tissue swelling or epiglottic enlargement. The cervical airway is unremarkable and no radio-opaque foreign body identified. Cartilaginous calcifications. Tongue piercing. IMPRESSION: No radiopaque foreign bodies or soft tissue swelling. Electronically Signed   By: Lucienne Capers M.D.   On: 11/23/2017 00:57   Dg Chest 2 View  Result Date: 11/23/2017 CLINICAL DATA:  Shortness of breath. Feels like something stuck in throat for 2 days. Current smoker. EXAM: CHEST - 2 VIEW  COMPARISON:  05/15/2016 FINDINGS: The heart size and mediastinal contours are within normal limits. Both lungs are clear. The visualized skeletal structures are unremarkable. IMPRESSION: No active cardiopulmonary disease. Electronically Signed   By: Lucienne Capers M.D.   On: 11/23/2017 00:56    Procedures Procedures (including critical care time)  Medications Ordered in ED Medications  gi cocktail (Maalox,Lidocaine,Donnatal) (30 mLs Oral Given 11/23/17 0053)     Initial Impression / Assessment and Plan / ED Course  I have reviewed the triage vital signs and the nursing notes.  Pertinent labs & imaging results that were available during my care of the patient were reviewed by me and considered in my medical decision making (see chart for details).    Foreign body sensation in her throat without difficulty breathing or swallowing.  No tongue or lip swelling.  No drooling. No CP or SOB. EKG nonischemic.   Patient given GI cocktail.  X-ray is obtained that shows no foreign body and no abnormal soft tissue planes.  Lab work is reassuring.  Urinalysis and hCG are negative.  Abdomen is soft without peritoneal signs.  Patient tolerating p.o. in the ED without difficulty.  Low suspicion for esophageal food impaction.  No radiopaque foreign body seen.  Rapid strep is negative.  Patient able swallow without difficulty.  Question whether acid reflux may be contributing to throat discomfort.  Will start PPI.  Follow-up with PCP.  Return precautions discussed.  Final Clinical Impressions(s) / ED Diagnoses   Final diagnoses:  Globus sensation    ED Discharge Orders    None       Lain Tetterton, Annie Main, MD 11/23/17 [phone]

## 2017-11-23 NOTE — ED Notes (Signed)
Pt aware of need for urine specimen. 

## 2017-11-23 NOTE — ED Notes (Signed)
Pt tolerated Fluid Challenge w/o complication. Pt provided Sprite to drink.

## 2018-04-29 ENCOUNTER — Other Ambulatory Visit: Payer: Self-pay

## 2018-04-29 ENCOUNTER — Emergency Department (HOSPITAL_COMMUNITY)
Admission: EM | Admit: 2018-04-29 | Discharge: 2018-04-29 | Disposition: A | Discharge status: Home / Self Care | Payer: 59 | Attending: Emergency Medicine | Admitting: Emergency Medicine

## 2018-04-29 ENCOUNTER — Encounter (HOSPITAL_COMMUNITY): Payer: Self-pay

## 2018-04-29 ENCOUNTER — Emergency Department (HOSPITAL_COMMUNITY): Payer: 59

## 2018-04-29 DIAGNOSIS — F17210 Nicotine dependence, cigarettes, uncomplicated: Secondary | ICD-10-CM | POA: Insufficient documentation

## 2018-04-29 DIAGNOSIS — I10 Essential (primary) hypertension: Secondary | ICD-10-CM | POA: Insufficient documentation

## 2018-04-29 DIAGNOSIS — M546 Pain in thoracic spine: Secondary | ICD-10-CM | POA: Diagnosis not present

## 2018-04-29 DIAGNOSIS — J45909 Unspecified asthma, uncomplicated: Secondary | ICD-10-CM | POA: Diagnosis not present

## 2018-04-29 DIAGNOSIS — R1084 Generalized abdominal pain: Secondary | ICD-10-CM | POA: Diagnosis present

## 2018-04-29 LAB — CBC WITH DIFFERENTIAL/PLATELET
Abs Immature Granulocytes: 0.02 10*3/uL (ref 0.00–0.07)
BASOS ABS: 0 10*3/uL (ref 0.0–0.1)
BASOS PCT: 0 %
EOS ABS: 0.2 10*3/uL (ref 0.0–0.5)
Eosinophils Relative: 3 %
HCT: 33.9 % — ABNORMAL LOW (ref 36.0–46.0)
HEMOGLOBIN: 10.4 g/dL — AB (ref 12.0–15.0)
Immature Granulocytes: 0 %
Lymphocytes Relative: 30 %
Lymphs Abs: 2.1 10*3/uL (ref 0.7–4.0)
MCH: 24.8 pg — ABNORMAL LOW (ref 26.0–34.0)
MCHC: 30.7 g/dL (ref 30.0–36.0)
MCV: 80.9 fL (ref 80.0–100.0)
MONO ABS: 1 10*3/uL (ref 0.1–1.0)
Monocytes Relative: 13 %
NRBC: 0 % (ref 0.0–0.2)
Neutro Abs: 3.9 10*3/uL (ref 1.7–7.7)
Neutrophils Relative %: 54 %
PLATELETS: 217 10*3/uL (ref 150–400)
RBC: 4.19 MIL/uL (ref 3.87–5.11)
RDW: 16.2 % — AB (ref 11.5–15.5)
WBC: 7.2 10*3/uL (ref 4.0–10.5)

## 2018-04-29 LAB — COMPREHENSIVE METABOLIC PANEL
ALBUMIN: 3.9 g/dL (ref 3.5–5.0)
ALK PHOS: 104 U/L (ref 38–126)
ALT: 60 U/L — ABNORMAL HIGH (ref 0–44)
ANION GAP: 10 (ref 5–15)
AST: 42 U/L — ABNORMAL HIGH (ref 15–41)
BUN: 10 mg/dL (ref 6–20)
CHLORIDE: 104 mmol/L (ref 98–111)
CO2: 21 mmol/L — AB (ref 22–32)
Calcium: 8.3 mg/dL — ABNORMAL LOW (ref 8.9–10.3)
Creatinine, Ser: 0.74 mg/dL (ref 0.44–1.00)
GFR calc non Af Amer: >60 mL/min (ref >60)
Glucose, Bld: 86 mg/dL (ref 70–99)
POTASSIUM: 3.3 mmol/L — AB (ref 3.5–5.1)
Sodium: 135 mmol/L (ref 135–145)
Total Bilirubin: 0.4 mg/dL (ref 0.3–1.2)
Total Protein: 7.8 g/dL (ref 6.5–8.1)

## 2018-04-29 LAB — URINALYSIS, ROUTINE W REFLEX MICROSCOPIC
Bilirubin Urine: NEGATIVE
Glucose, UA: NEGATIVE mg/dL
Ketones, ur: NEGATIVE mg/dL
Leukocytes,Ua: NEGATIVE
Nitrite: NEGATIVE
PH: 8 (ref 5.0–8.0)
Protein, ur: NEGATIVE mg/dL
Specific Gravity, Urine: 1.002 — ABNORMAL LOW (ref 1.005–1.030)

## 2018-04-29 LAB — LIPASE, BLOOD: LIPASE: 31 U/L (ref 11–51)

## 2018-04-29 LAB — PREGNANCY, URINE: PREG TEST UR: NEGATIVE

## 2018-04-29 MED ORDER — HYDROCODONE-ACETAMINOPHEN 5-325 MG PO TABS: 1.0000 | ORAL_TABLET | ORAL | 0 refills | Status: DC | PRN

## 2018-04-29 MED ORDER — MORPHINE SULFATE (PF) 4 MG/ML IV SOLN
4.0000 mg | Freq: Once | INTRAVENOUS | Status: AC
  Administered 2018-04-29: 4 mg via INTRAVENOUS
  Filled 2018-04-29: qty 1

## 2018-04-29 MED ORDER — DEXAMETHASONE SODIUM PHOSPHATE 10 MG/ML IJ SOLN
10.0000 mg | Freq: Once | INTRAMUSCULAR | Status: AC
  Administered 2018-04-29: 10 mg via INTRAVENOUS
  Filled 2018-04-29: qty 1

## 2018-04-29 MED ORDER — ONDANSETRON HCL 4 MG/2ML IJ SOLN
4.0000 mg | Freq: Once | INTRAMUSCULAR | Status: AC
  Administered 2018-04-29: 4 mg via INTRAVENOUS
  Filled 2018-04-29: qty 2

## 2018-04-29 MED ORDER — METHOCARBAMOL 500 MG PO TABS
1000.0000 mg | ORAL_TABLET | Freq: Once | ORAL | Status: AC
  Administered 2018-04-29: 1000 mg via ORAL
  Filled 2018-04-29: qty 2

## 2018-04-29 MED ORDER — HYDROCODONE-ACETAMINOPHEN 5-325 MG PO TABS
1.0000 | ORAL_TABLET | Freq: Once | ORAL | Status: AC
  Administered 2018-04-29: 1 via ORAL
  Filled 2018-04-29: qty 1

## 2018-04-29 MED ORDER — KETOROLAC TROMETHAMINE 30 MG/ML IJ SOLN
30.0000 mg | Freq: Once | INTRAMUSCULAR | Status: AC
  Administered 2018-04-29: 30 mg via INTRAMUSCULAR
  Filled 2018-04-29: qty 1

## 2018-04-29 MED ORDER — PREDNISONE 10 MG PO TABS: ORAL_TABLET | ORAL | 0 refills | Status: DC

## 2018-04-29 NOTE — ED Provider Notes (Signed)
[facility] EMERGENCY DEPARTMENT Provider Note   CSN: [id number] Arrival date & time: 04/29/18  1845    History   Chief Complaint Chief Complaint  Patient presents with  . Back Pain    HPI Bonnie Jones is a 38 y.o. female with a history of HTN, Bonnie Jones, and prior cholecystitis with subsequent cholecystectomy in 2015 presenting with sudden onset of right posterior flank pain which started 2 days ago.  She reports having a sore throat and a moderate nonproductive cough starting the day before her pain began. These symptoms  improved but the back pain has been worsening.  Pain is worsened with deep inspiration, movement, palpation of her right flank and even talking causes pain.  She denies fevers, chills, sob, chest pain and no n/v or abdominal pain. She also denies hematuria, dysuria or urinary urgency or frequency. Denies kidney stone hx. She is currently on the last day of her menses with mild spotting. She denies vaginal discharge.  She has taken ibuprofen and tylenol cold and flu without improvement in pain.     The history is provided by the patient.    Past Medical History:  Diagnosis Date  . Abnormal Pap smear   . Asthma    as child  . GERD (gastroesophageal reflux disease)   . Hypertension   . Pregnant   . Skin infection 09/16/2012  . Weight decrease     Patient Active Problem List   Diagnosis Date Noted  . Calculous cholecystitis with obstruction 10/14/2013  . De Quervain's syndrome (tenosynovitis) 11/25/2012  . Sterilization 08/23/2012  . 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 Benign essential hypertension 06/03/2012    Past Surgical History:  Procedure Laterality Date  . BACK SURGERY    . CHOLECYSTECTOMY N/A 10/16/2013   Procedure: LAPAROSCOPIC CHOLECYSTECTOMY;  Surgeon: Jamesetta So, MD;  Location: [facility];  Service: General;  Laterality: N/A;  . MASS EXCISION  02/02/2011   Procedure: EXCISION MASS;  Surgeon: Belva Crome, MD;  Location: [place];   Service: General;  Laterality: Left;  Excision of LEFT UPPER BACK MASS  . TONSILLECTOMY    . TONSILLECTOMY AND ADENOIDECTOMY    . TUBAL LIGATION N/A 08/23/2012   Procedure: POST PARTUM TUBAL LIGATION with filshie clips and removal of nevi;  Surgeon: Donnamae Jude, MD;  Location: [facility];  Service: Gynecology;  Laterality: N/A;     OB History    Gravida  3   Para  2   Term  2   Preterm      AB  1   Living  2     SAB      TAB  1   Ectopic      Multiple      Live Births  2            Home Medications    Prior to Admission medications   Medication Sig Start Date End Date Taking? Authorizing Provider  HYDROcodone-acetaminophen (NORCO/VICODIN) 5-325 MG tablet Take 1 tablet by mouth every 4 (four) hours as needed. 04/29/18   Emmajo Bennette, Almyra Free, PA-C  predniSONE (DELTASONE) 10 MG tablet Take 6 tablets day one, 5 tablets day two, 4 tablets day three, 3 tablets day four, 2 tablets day five, then 1 tablet day six 04/29/18   Evalee Jefferson, PA-C    Family History Family History  Problem Relation Age of Onset  . Hypertension Mother   . Heart disease Mother   . Diabetes Mother   .  Cancer Mother   . Other Son        sickle cell gene    Social History Social History   Tobacco Use  . Smoking status: Current Every Day Smoker    Packs/day: 0.50    Years: 10.00    Pack years: 5.00    Types: Cigarettes  . Smokeless tobacco: Never Used  Substance Use Topics  . Alcohol use: Yes    Comment: beer occ  . Drug use: No     Allergies   Patient has no known allergies.   Review of Systems Review of Systems  Constitutional: Negative for chills and fever.  HENT: Positive for sore throat. Negative for congestion.   Eyes: Negative.   Respiratory: Positive for cough. Negative for chest tightness, shortness of breath and wheezing.   Cardiovascular: Negative for chest pain.  Gastrointestinal: Negative for abdominal pain, nausea and vomiting.  Genitourinary: Positive for flank pain.  Negative for decreased urine volume, dysuria, hematuria, menstrual problem and vaginal discharge.  Musculoskeletal: Negative for arthralgias, joint swelling and neck pain.  Skin: Negative.  Negative for rash and wound.  Neurological: Negative for dizziness, weakness, light-headedness, numbness and headaches.  Psychiatric/Behavioral: Negative.      Physical Exam Updated Vital Signs BP 126/74   Pulse 84   Temp 98.3 F (36.8 C) (Oral)   Resp 18   Ht 5\' 5"  (1.651 m)   Wt 95.3 kg   LMP 04/25/2018   SpO2 100%   BMI 34.95 kg/m   Physical Exam Vitals signs and nursing note reviewed.  Constitutional:      Appearance: She is well-developed.  HENT:     Head: Normocephalic and atraumatic.  Eyes:     Conjunctiva/sclera: Conjunctivae normal.  Neck:     Musculoskeletal: Normal range of motion.  Cardiovascular:     Rate and Rhythm: Normal rate and regular rhythm.     Heart sounds: Normal heart sounds.  Pulmonary:     Effort: Pulmonary effort is normal. No tachypnea, accessory muscle usage, prolonged expiration or retractions.     Breath sounds: Normal breath sounds. Decreased air movement present. No wheezing, rhonchi or rales.       Comments: TTP right flank.  Poor effort. Pt splinting secondary to  pain. No wheeze, no rhonchi. No rash. There is mild soft tissue swelling right flank, no erythema or induration.  Abdominal:     General: Bowel sounds are normal. There is no distension.     Palpations: Abdomen is soft. There is no mass.     Tenderness: There is abdominal tenderness in the right upper quadrant. There is no guarding.     Comments: Deep palpation RUQ causes pain right flank.  Musculoskeletal: Normal range of motion.     Thoracic back: She exhibits bony tenderness.     Comments: ttp right posterior ribcage. No midline thoracic pain.  Skin:    General: Skin is warm and dry.  Neurological:     Mental Status: She is alert.      ED Treatments / Results  Labs (all labs  ordered are listed, but only abnormal results are displayed) Labs Reviewed  URINALYSIS, ROUTINE W REFLEX MICROSCOPIC - Abnormal; Notable for the following components:      Result Value   Specific Gravity, Urine 1.002 (*)    Hgb urine dipstick LARGE (*)    Bacteria, UA RARE (*)    All other components within normal limits  CBC WITH DIFFERENTIAL/PLATELET - Abnormal; Notable for the  following components:   Hemoglobin 10.4 (*)    HCT 33.9 (*)    MCH 24.8 (*)    RDW 16.2 (*)    All other components within normal limits  COMPREHENSIVE METABOLIC PANEL - Abnormal; Notable for the following components:   Potassium 3.3 (*)    CO2 21 (*)    Calcium 8.3 (*)    AST 42 (*)    ALT 60 (*)    All other components within normal limits  PREGNANCY, URINE  LIPASE, BLOOD    EKG None  Radiology Dg Ribs Unilateral W/chest Right  Result Date: 04/29/2018 CLINICAL DATA:  Posterior rib pain EXAM: RIGHT RIBS AND CHEST - 3+ VIEW COMPARISON:  11/23/2017 FINDINGS: No fracture or other bone lesions are seen involving the ribs. There is no evidence of pneumothorax or pleural effusion. Both lungs are clear. Heart size and mediastinal contours are within normal limits. IMPRESSION: Negative. Electronically Signed   By: Donavan Foil M.D.   On: 04/29/2018 19:48   Ct Renal Stone Study  Result Date: 04/29/2018 CLINICAL DATA:  Right-sided flank pain EXAM: CT ABDOMEN AND PELVIS WITHOUT CONTRAST TECHNIQUE: Multidetector CT imaging of the abdomen and pelvis was performed following the standard protocol without IV contrast. COMPARISON:  CT 04/27/2008 FINDINGS: Lower chest: No acute abnormality. Hepatobiliary: No focal liver abnormality is seen. Status post cholecystectomy. No biliary dilatation. Pancreas: Unremarkable. No pancreatic ductal dilatation or surrounding inflammatory changes. Spleen: Normal in size without focal abnormality. Adrenals/Urinary Tract: Adrenal glands are unremarkable. Kidneys are normal, without renal  calculi, focal lesion, or hydronephrosis. Bladder is unremarkable. Stomach/Bowel: Stomach is within normal limits. Appendix appears normal. No evidence of bowel wall thickening, distention, or inflammatory changes. Vascular/Lymphatic: No significant vascular findings are present. No enlarged abdominal or pelvic lymph nodes. Reproductive: Uterus unremarkable. No adnexal mass. Left tubal ligation clip appears inferiorly displaced from the left adnexa. Other: No abdominal wall hernia or abnormality. No abdominopelvic ascites. Musculoskeletal: No acute or significant osseous findings. IMPRESSION: 1. No CT evidence for acute intra-abdominal or pelvic abnormality. Negative for hydronephrosis or ureteral stone 2. Negative for acute appendicitis 3. Suspect that left tubal ligation clip is dislodged from the left adnexa. Electronically Signed   By: Donavan Foil M.D.   On: 04/29/2018 22:55    Procedures Procedures (including critical care time)  Medications Ordered in ED Medications  HYDROcodone-acetaminophen (NORCO/VICODIN) 5-325 MG per tablet 1 tablet (1 tablet Oral Given 04/29/18 1933)  ketorolac (TORADOL) 30 MG/ML injection 30 mg (30 mg Intramuscular Given 04/29/18 2022)  methocarbamol (ROBAXIN) tablet 1,000 mg (1,000 mg Oral Given 04/29/18 2021)  morphine 4 MG/ML injection 4 mg (4 mg Intravenous Given 04/29/18 2142)  ondansetron (ZOFRAN) injection 4 mg (4 mg Intravenous Given 04/29/18 2142)  dexamethasone (DECADRON) injection 10 mg (10 mg Intravenous Given 04/29/18 2341)     Initial Impression / Assessment and Plan / ED Course  I have reviewed the triage vital signs and the nursing notes.  Pertinent labs & imaging results that were available during my care of the patient were reviewed by me and considered in my medical decision making (see chart for details).       Pt with pleuritic right flank pain which is reproducible also with palpation and postional changes.  Afebrile, no sob, no cp or  palpitations.  VSS without hypoxia or tachypnea, no risk factors for PE.   No renal stones or urinary tree pathology, liver normal appearance, gallbladder absent, no intestinal obstruction. Sx likely musculoskeletal.  Could  be pleurisy, but atypical given reproducible component.  She was placed on prednisone, hydrocodone prescribed for pain relief. Discussed heat tx. Plan f/u with pcp if not improving over the next several days.   Final Clinical Impressions(s) / ED Diagnoses   Final diagnoses:  Acute right-sided thoracic back pain    ED Discharge Orders         Ordered    predniSONE (DELTASONE) 10 MG tablet     04/29/18 2321    HYDROcodone-acetaminophen (NORCO/VICODIN) 5-325 MG tablet  Every 4 hours PRN     04/29/18 2321           Evalee Jefferson, PA-C 04/30/18 0107    Noemi Chapel, MD 05/01/18 1335

## 2018-04-29 NOTE — ED Triage Notes (Signed)
Pt complaining of mid back pain on right side started on Sunday. Pt states back hurts when she breathes and moves.

## 2018-04-29 NOTE — Discharge Instructions (Signed)
Your lab tests and imaging tonight are negative, suggesting your pain is from a musculoskeletal source.  You have been placed on prednisone which is a strong anti inflammatory pain medicine - take your next dose tomorrow evening as you received a similar medicine in your IV tonight.  You may take the hydrocodone prescribed for pain relief.  This will make you drowsy - do not drive within 4 hours of taking this medication.  Get rechecked if you develop any shortness of breath, palpitations, chest pain or weakness. Plan to see your doctor for a recheck if not improving over the next several days.

## 2018-12-26 ENCOUNTER — Telehealth: Payer: Self-pay | Admitting: Obstetrics & Gynecology

## 2018-12-26 NOTE — Telephone Encounter (Signed)

## 2018-12-29 ENCOUNTER — Ambulatory Visit (INDEPENDENT_AMBULATORY_CARE_PROVIDER_SITE_OTHER): Payer: 59 | Admitting: Obstetrics & Gynecology

## 2018-12-29 ENCOUNTER — Encounter: Payer: Self-pay | Admitting: Obstetrics & Gynecology

## 2018-12-29 ENCOUNTER — Other Ambulatory Visit: Payer: Self-pay | Admitting: Obstetrics & Gynecology

## 2018-12-29 ENCOUNTER — Other Ambulatory Visit: Payer: Self-pay

## 2018-12-29 VITALS — BP 158/93 | HR 84 | Ht 65.5 in | Wt 207.0 lb

## 2018-12-29 DIAGNOSIS — N920 Excessive and frequent menstruation with regular cycle: Secondary | ICD-10-CM | POA: Diagnosis not present

## 2018-12-29 DIAGNOSIS — N946 Dysmenorrhea, unspecified: Secondary | ICD-10-CM | POA: Diagnosis not present

## 2018-12-29 MED ORDER — MEGESTROL ACETATE 40 MG PO TABS: ORAL_TABLET | ORAL | 3 refills | Status: DC

## 2018-12-29 NOTE — Progress Notes (Signed)
Chief Complaint  Patient presents with  . Discuss Surgery    wants ablation    Blood pressure (!) 158/93, pulse 84, height 5' 5.5" (1.664 m), weight 207 lb (93.9 kg), last menstrual period 12/10/2018.  38 y.o. [id number] Patient's last menstrual period was 12/10/2018 (exact date). The current method of family planning is tubal ligation.  Subjective  Periods:  regular periods every 30 days Bleeds:  8 days Clots:  yes Products Used: pads/tampons/towels Soils:yes Pain with menses:yes Pain at other times of month:no Pain with intercourse:no Bleeding with intercourse:no     Objective  General:  WDWN NAD Just had Pap will obtain from [facility]    Pertinent ROS No burning with urination, frequency or urgency No nausea, vomiting or diarrhea Nor fever chills or other constitutional symptoms   Labs or studies Hemoglobin: 10.4       Impression Diagnoses this Encounter::   ICD-10-CM   1. Menorrhagia with regular cycle  N92.0 US Transvaginal Non-OB    GYN Trans ABD  2. Dysmenorrhea  N94.6 US Transvaginal Non-OB    GYN Trans ABD    Established relevant diagnosis(es):   Plan/Recommendations: Meds ordered this encounter  Medications  . megestrol (MEGACE) 40 MG tablet    Sig: 3 tablets a day for 5 days, 2 tablets a day for 5 days then 1 tablet daily    Dispense:  45 tablet    Refill:  3    Labs or Scans Ordered: Orders Placed This Encounter  Procedures  . US Transvaginal Non-OB  . GYN Trans ABD    Management: Discussion/MDM: >megace algorithm >sonogram 3 weeks  Based on response to megestrol and sonogram will decide on management , pt initially not interested in IUD or pills for cycle management  Follow up Return in about 3 weeks (around 01/19/2019) for GYN sono, Follow up, with Dr Elonda Husky.        Face to face time:  15 minutes  Greater than 50% of the visit time was spent in counseling and coordination of care with the patient.  The summary  and outline of the counseling and care coordination is summarized in the note above.   All questions were answered.  Past Medical History:  Diagnosis Date  . Abnormal Pap smear   . Asthma    as child  . GERD (gastroesophageal reflux disease)   . Hypertension   . Pregnant   . Skin infection 09/16/2012  . Weight decrease     Past Surgical History:  Procedure Laterality Date  . BACK SURGERY    . CHOLECYSTECTOMY N/A 10/16/2013   Procedure: LAPAROSCOPIC CHOLECYSTECTOMY;  Surgeon: Jamesetta So, MD;  Location: [facility];  Service: General;  Laterality: N/A;  . MASS EXCISION  02/02/2011   Procedure: EXCISION MASS;  Surgeon: Belva Crome, MD;  Location: [place];  Service: General;  Laterality: Left;  Excision of LEFT UPPER BACK MASS  . TONSILLECTOMY    . TONSILLECTOMY AND ADENOIDECTOMY    . TUBAL LIGATION N/A 08/23/2012   Procedure: POST PARTUM TUBAL LIGATION with filshie clips and removal of nevi;  Surgeon: Donnamae Jude, MD;  Location: [facility];  Service: Gynecology;  Laterality: N/A;    OB History    Gravida  3   Para  2   Term  2   Preterm      AB  1   Living  2     SAB      TAB  1   Ectopic      Multiple      Live Births  2           No Known Allergies  Social History   Socioeconomic History  . Marital status: Single    Spouse name: Not on file  . Number of children: Not on file  . Years of education: Not on file  . Highest education level: Not on file  Occupational History  . Not on file  Social Needs  . Financial resource strain: Not on file  . Food insecurity    Worry: Not on file    Inability: Not on file  . Transportation needs    Medical: Not on file    Non-medical: Not on file  Tobacco Use  . Smoking status: Current Every Day Smoker    Packs/day: 0.50    Years: 10.00    Pack years: 5.00    Types: Cigarettes  . Smokeless tobacco: Never Used  Substance and Sexual Activity  . Alcohol use: Yes    Comment: beer occ  . Drug use: No  .  Sexual activity: Yes    Birth control/protection: Surgical  Lifestyle  . Physical activity    Days per week: Not on file    Minutes per session: Not on file  . Stress: Not on file  Relationships  . Social Herbalist on phone: Not on file    Gets together: Not on file    Attends religious service: Not on file    Active member of club or organization: Not on file    Attends meetings of clubs or organizations: Not on file    Relationship status: Not on file  Other Topics Concern  . Not on file  Social History Narrative  . Not on file    Family History  Problem Relation Age of Onset  . Hypertension Mother   . Heart disease Mother   . Diabetes Mother   . Cancer Mother   . Other Son        sickle cell gene

## 2019-01-01 ENCOUNTER — Telehealth: Payer: Self-pay | Admitting: *Deleted

## 2019-01-01 NOTE — Telephone Encounter (Signed)
Patient called with questions regarding the Megace admin instructions.

## 2019-01-01 NOTE — Telephone Encounter (Signed)
Spoke with pt letting her know it was ok to take 3 megace tabs at the same time, then 2 tabs at the same time. Pt voiced understanding. [place]

## 2019-01-15 ENCOUNTER — Telehealth: Payer: Self-pay | Admitting: Obstetrics & Gynecology

## 2019-01-15 NOTE — Telephone Encounter (Signed)

## 2019-01-16 ENCOUNTER — Other Ambulatory Visit: Payer: 59

## 2019-01-16 ENCOUNTER — Ambulatory Visit: Payer: 59 | Admitting: Obstetrics & Gynecology

## 2019-02-10 ENCOUNTER — Other Ambulatory Visit: Payer: Self-pay

## 2019-02-10 ENCOUNTER — Ambulatory Visit: Payer: 59 | Attending: Internal Medicine

## 2019-02-10 DIAGNOSIS — Z20822 Contact with and (suspected) exposure to covid-19: Secondary | ICD-10-CM

## 2019-02-12 LAB — NOVEL CORONAVIRUS, NAA: SARS-CoV-2, NAA: NOT DETECTED

## 2019-02-24 ENCOUNTER — Ambulatory Visit: Payer: 59 | Attending: Internal Medicine

## 2019-02-24 ENCOUNTER — Other Ambulatory Visit: Payer: Self-pay

## 2019-02-24 DIAGNOSIS — Z20822 Contact with and (suspected) exposure to covid-19: Secondary | ICD-10-CM

## 2019-02-25 LAB — NOVEL CORONAVIRUS, NAA: SARS-CoV-2, NAA: DETECTED — AB

## 2019-03-09 ENCOUNTER — Other Ambulatory Visit: Payer: Self-pay

## 2019-03-09 ENCOUNTER — Ambulatory Visit: Payer: 59 | Attending: Internal Medicine

## 2019-03-09 DIAGNOSIS — Z20822 Contact with and (suspected) exposure to covid-19: Secondary | ICD-10-CM

## 2019-03-10 LAB — NOVEL CORONAVIRUS, NAA: SARS-CoV-2, NAA: NOT DETECTED

## 2019-10-30 ENCOUNTER — Other Ambulatory Visit: Payer: Self-pay

## 2019-10-30 ENCOUNTER — Other Ambulatory Visit: Payer: 59

## 2019-10-30 DIAGNOSIS — Z20822 Contact with and (suspected) exposure to covid-19: Secondary | ICD-10-CM

## 2019-11-02 ENCOUNTER — Other Ambulatory Visit: Payer: Self-pay

## 2019-11-02 DIAGNOSIS — Z20822 Contact with and (suspected) exposure to covid-19: Secondary | ICD-10-CM

## 2019-11-02 LAB — NOVEL CORONAVIRUS, NAA: SARS-CoV-2, NAA: NOT DETECTED

## 2019-11-04 LAB — SPECIMEN STATUS REPORT

## 2019-11-04 LAB — NOVEL CORONAVIRUS, NAA: SARS-CoV-2, NAA: NOT DETECTED

## 2019-11-04 LAB — SARS-COV-2, NAA 2 DAY TAT

## 2019-11-05 ENCOUNTER — Telehealth: Payer: Self-pay | Admitting: *Deleted

## 2019-11-05 NOTE — Telephone Encounter (Signed)
Pt is aware covid 19 test is neg on 11-05-2019

## 2020-02-24 ENCOUNTER — Ambulatory Visit: Payer: Self-pay

## 2020-03-29 ENCOUNTER — Other Ambulatory Visit (HOSPITAL_COMMUNITY): Payer: Self-pay | Admitting: Physician Assistant

## 2020-03-29 DIAGNOSIS — Z1231 Encounter for screening mammogram for malignant neoplasm of breast: Secondary | ICD-10-CM

## 2020-03-31 ENCOUNTER — Emergency Department (HOSPITAL_COMMUNITY)
Admission: EM | Admit: 2020-03-31 | Discharge: 2020-03-31 | Disposition: A | Discharge status: Home / Self Care | Payer: Medicaid Other | Attending: Emergency Medicine | Admitting: Emergency Medicine

## 2020-03-31 ENCOUNTER — Encounter (HOSPITAL_COMMUNITY): Payer: Self-pay | Admitting: Emergency Medicine

## 2020-03-31 ENCOUNTER — Other Ambulatory Visit: Payer: Self-pay

## 2020-03-31 ENCOUNTER — Emergency Department (HOSPITAL_COMMUNITY): Payer: Medicaid Other

## 2020-03-31 DIAGNOSIS — I160 Hypertensive urgency: Secondary | ICD-10-CM

## 2020-03-31 DIAGNOSIS — R519 Headache, unspecified: Secondary | ICD-10-CM | POA: Insufficient documentation

## 2020-03-31 DIAGNOSIS — F17210 Nicotine dependence, cigarettes, uncomplicated: Secondary | ICD-10-CM | POA: Diagnosis not present

## 2020-03-31 DIAGNOSIS — J45909 Unspecified asthma, uncomplicated: Secondary | ICD-10-CM | POA: Diagnosis not present

## 2020-03-31 DIAGNOSIS — I10 Essential (primary) hypertension: Secondary | ICD-10-CM | POA: Diagnosis not present

## 2020-03-31 DIAGNOSIS — R030 Elevated blood-pressure reading, without diagnosis of hypertension: Secondary | ICD-10-CM | POA: Diagnosis present

## 2020-03-31 LAB — CBC WITH DIFFERENTIAL/PLATELET
Abs Immature Granulocytes: 0.02 10*3/uL (ref 0.00–0.07)
Basophils Absolute: 0 10*3/uL (ref 0.0–0.1)
Basophils Relative: 0 %
Eosinophils Absolute: 0.2 10*3/uL (ref 0.0–0.5)
Eosinophils Relative: 3 %
HCT: 43.1 % (ref 36.0–46.0)
Hemoglobin: 14.3 g/dL (ref 12.0–15.0)
Immature Granulocytes: 0 %
Lymphocytes Relative: 31 %
Lymphs Abs: 2.8 10*3/uL (ref 0.7–4.0)
MCH: 29.9 pg (ref 26.0–34.0)
MCHC: 33.2 g/dL (ref 30.0–36.0)
MCV: 90 fL (ref 80.0–100.0)
Monocytes Absolute: 0.5 10*3/uL (ref 0.1–1.0)
Monocytes Relative: 6 %
Neutro Abs: 5.4 10*3/uL (ref 1.7–7.7)
Neutrophils Relative %: 60 %
Platelets: 228 10*3/uL (ref 150–400)
RBC: 4.79 MIL/uL (ref 3.87–5.11)
RDW: 13 % (ref 11.5–15.5)
WBC: 9 10*3/uL (ref 4.0–10.5)
nRBC: 0 % (ref 0.0–0.2)

## 2020-03-31 LAB — BASIC METABOLIC PANEL
Anion gap: 7 (ref 5–15)
BUN: 14 mg/dL (ref 6–20)
CO2: 23 mmol/L (ref 22–32)
Calcium: 9.1 mg/dL (ref 8.9–10.3)
Chloride: 104 mmol/L (ref 98–111)
Creatinine, Ser: 0.71 mg/dL (ref 0.44–1.00)
GFR, Estimated: >60 mL/min (ref >60)
Glucose, Bld: 91 mg/dL (ref 70–99)
Potassium: 3.4 mmol/L — ABNORMAL LOW (ref 3.5–5.1)
Sodium: 134 mmol/L — ABNORMAL LOW (ref 135–145)

## 2020-03-31 LAB — POC URINE PREG, ED: Preg Test, Ur: NEGATIVE

## 2020-03-31 MED ORDER — IBUPROFEN 800 MG PO TABS
800.0000 mg | ORAL_TABLET | Freq: Once | ORAL | Status: AC
  Administered 2020-03-31: 800 mg via ORAL
  Filled 2020-03-31: qty 1

## 2020-03-31 NOTE — ED Provider Notes (Signed)
[facility] EMERGENCY DEPARTMENT Provider Note   CSN: [id number] Arrival date & time: 03/31/20  0006     History Chief Complaint  Patient presents with  . Hypertension    Bonnie Jones is a 40 y.o. female.  Patient is a 40 year old female with history of hypertension, GERD, asthma.  Patient presents today for evaluation of headache, blurry vision, and elevated blood pressure.  Patient had a routine doctor's appointment yesterday.  She was found to have markedly elevated blood pressure at the doctor's office and was started on lisinopril.  This evening she began with headache and feeling as though her vision was blurry and presents for evaluation of this.  She checked her blood pressure at home and it was well over 200.  She denies any weakness or numbness.  She denies any chest pain.  The history is provided by the patient.  Hypertension This is a new problem. The current episode started 2 days ago. The problem occurs constantly. The problem has not changed since onset.Associated symptoms include headaches. Pertinent negatives include no chest pain. Nothing aggravates the symptoms. Nothing relieves the symptoms. She has tried nothing for the symptoms.       Past Medical History:  Diagnosis Date  . Abnormal Pap smear   . Asthma    as child  . GERD (gastroesophageal reflux disease)   . Hypertension   . Pregnant   . Skin infection 09/16/2012  . Weight decrease     Patient Active Problem List   Diagnosis Date Noted  . Calculous cholecystitis with obstruction 10/14/2013  . De Quervain's syndrome (tenosynovitis) 11/25/2012  . Sterilization 08/23/2012  . 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 Benign essential hypertension 06/03/2012    Past Surgical History:  Procedure Laterality Date  . BACK SURGERY    . CHOLECYSTECTOMY N/A 10/16/2013   Procedure: LAPAROSCOPIC CHOLECYSTECTOMY;  Surgeon: Jamesetta So, MD;  Location: [facility];  Service: General;  Laterality: N/A;  .  MASS EXCISION  02/02/2011   Procedure: EXCISION MASS;  Surgeon: Belva Crome, MD;  Location: [place];  Service: General;  Laterality: Left;  Excision of LEFT UPPER BACK MASS  . TONSILLECTOMY    . TONSILLECTOMY AND ADENOIDECTOMY    . TUBAL LIGATION N/A 08/23/2012   Procedure: POST PARTUM TUBAL LIGATION with filshie clips and removal of nevi;  Surgeon: Donnamae Jude, MD;  Location: [facility];  Service: Gynecology;  Laterality: N/A;     OB History    Gravida  3   Para  2   Term  2   Preterm      AB  1   Living  2     SAB      IAB  1   Ectopic      Multiple      Live Births  2           Family History  Problem Relation Age of Onset  . Hypertension Mother   . Heart disease Mother   . Diabetes Mother   . Cancer Mother   . Other Son        sickle cell gene    Social History   Tobacco Use  . Smoking status: Current Every Day Smoker    Packs/day: 0.50    Years: 10.00    Pack years: 5.00    Types: Cigarettes  . Smokeless tobacco: Never Used  Vaping Use  . Vaping Use: Never used  Substance Use Topics  .  Alcohol use: Yes    Comment: beer occ  . Drug use: No    Home Medications Prior to Admission medications   Medication Sig Start Date End Date Taking? Authorizing Provider  famotidine (PEPCID) 20 MG tablet Take 20 mg by mouth at bedtime. 11/25/18   [provider]  megestrol (MEGACE) 40 MG tablet 3 tablets a day for 5 days, 2 tablets a day for 5 days then 1 tablet daily 12/29/18   Florian Buff, MD  valACYclovir (VALTREX) 500 MG tablet Take 500 mg by mouth 2 (two) times daily as needed. 11/25/18   [provider]    Allergies    Patient has no known allergies.  Review of Systems   Review of Systems  Cardiovascular: Negative for chest pain.  Neurological: Positive for headaches.  All other systems reviewed and are negative.   Physical Exam Updated Vital Signs BP (!) 167/80   Pulse 60   Temp 98.2 F (36.8 C) (Oral)   Resp 16    Ht 5\' 5"  (1.651 m)   Wt 99.8 kg   SpO2 99%   BMI 36.61 kg/m   Physical Exam Vitals and nursing note reviewed.  Constitutional:      General: She is not in acute distress.    Appearance: She is well-developed and well-nourished. She is not diaphoretic.  HENT:     Head: Normocephalic and atraumatic.  Eyes:     Extraocular Movements: Extraocular movements intact.     Pupils: Pupils are equal, round, and reactive to light.  Cardiovascular:     Rate and Rhythm: Normal rate and regular rhythm.     Heart sounds: No murmur heard. No friction rub. No gallop.   Pulmonary:     Effort: Pulmonary effort is normal. No respiratory distress.     Breath sounds: Normal breath sounds. No wheezing.  Abdominal:     General: Bowel sounds are normal. There is no distension.     Palpations: Abdomen is soft.     Tenderness: There is no abdominal tenderness.  Musculoskeletal:        General: Normal range of motion.     Cervical back: Normal range of motion and neck supple.  Skin:    General: Skin is warm and dry.  Neurological:     General: No focal deficit present.     Mental Status: She is alert and oriented to person, place, and time.     Cranial Nerves: No cranial nerve deficit.     Sensory: No sensory deficit.     Motor: No weakness.     Coordination: Coordination normal.     ED Results / Procedures / Treatments   Labs (all labs ordered are listed, but only abnormal results are displayed) Labs Reviewed - No data to display  EKG None  Radiology No results found.  Procedures Procedures   Medications Ordered in ED Medications - No data to display  ED Course  I have reviewed the triage vital signs and the nursing notes.  Pertinent labs & imaging results that were available during my care of the patient were reviewed by me and considered in my medical decision making (see chart for details).    MDM Rules/Calculators/A&P  Patient presenting here over concerns of elevated  blood pressure, headache, and blurry vision.  Patient arrives here with stable vital signs and is neurologically intact.  Her initial blood pressure was 191/105, however has improved to 142/80 with no intervention.  Laboratory studies showed no  evidence for endorgan damage.  Her electrolytes are unremarkable, renal function is normal, CBC is unremarkable, and CT scan of the head shows no evidence for stroke.  At this point, discharge seems appropriate with outpatient follow-up.  Final Clinical Impression(s) / ED Diagnoses Final diagnoses:  None    Rx / DC Orders ED Discharge Orders    None       Veryl Speak, MD 03/31/20 0345

## 2020-03-31 NOTE — ED Notes (Signed)
Patient transported to CT 

## 2020-03-31 NOTE — Discharge Instructions (Addendum)
Continue lisinopril as previously prescribed.  Keep a record of your blood pressures at home and take this with you to your next doctor's appointment for them to review.  Return to the emergency department in the meantime if you develop worsening headache, weakness/numbness, or other new and concerning symptoms.

## 2020-03-31 NOTE — ED Triage Notes (Signed)
Pt seen yesterday by PCP and placed on HTN medications. Tonight pt began having headache and blurred vision. Pt states BP at home was 285/170.

## 2020-04-01 ENCOUNTER — Ambulatory Visit (HOSPITAL_COMMUNITY): Payer: Medicaid Other

## 2020-04-06 ENCOUNTER — Other Ambulatory Visit: Payer: Self-pay

## 2020-04-06 ENCOUNTER — Ambulatory Visit (HOSPITAL_COMMUNITY)
Admission: RE | Admit: 2020-04-06 | Discharge: 2020-04-06 | Disposition: A | Discharge status: Home / Self Care | Payer: Medicaid Other | Source: Ambulatory Visit | Attending: Physician Assistant | Admitting: Physician Assistant

## 2020-04-06 DIAGNOSIS — Z1231 Encounter for screening mammogram for malignant neoplasm of breast: Secondary | ICD-10-CM | POA: Insufficient documentation

## 2020-04-12 ENCOUNTER — Other Ambulatory Visit (HOSPITAL_COMMUNITY): Payer: Self-pay | Admitting: Physician Assistant

## 2020-04-12 DIAGNOSIS — R928 Other abnormal and inconclusive findings on diagnostic imaging of breast: Secondary | ICD-10-CM

## 2020-04-15 ENCOUNTER — Ambulatory Visit (HOSPITAL_COMMUNITY): Payer: Medicaid Other

## 2020-04-15 ENCOUNTER — Encounter (HOSPITAL_COMMUNITY): Payer: Self-pay

## 2020-04-15 ENCOUNTER — Encounter (HOSPITAL_COMMUNITY): Payer: Medicaid Other

## 2020-05-17 ENCOUNTER — Ambulatory Visit (HOSPITAL_COMMUNITY)
Admission: RE | Admit: 2020-05-17 | Discharge: 2020-05-17 | Disposition: A | Discharge status: Home / Self Care | Payer: Medicaid Other | Source: Ambulatory Visit | Attending: Physician Assistant | Admitting: Physician Assistant

## 2020-05-17 DIAGNOSIS — R928 Other abnormal and inconclusive findings on diagnostic imaging of breast: Secondary | ICD-10-CM

## 2021-03-22 ENCOUNTER — Other Ambulatory Visit: Payer: Self-pay

## 2021-03-22 ENCOUNTER — Emergency Department (HOSPITAL_COMMUNITY)
Admission: EM | Admit: 2021-03-22 | Discharge: 2021-03-22 | Disposition: A | Discharge status: Home / Self Care | Payer: Medicaid Other | Attending: Emergency Medicine | Admitting: Emergency Medicine

## 2021-03-22 ENCOUNTER — Encounter (HOSPITAL_COMMUNITY): Payer: Self-pay | Admitting: *Deleted

## 2021-03-22 DIAGNOSIS — H60502 Unspecified acute noninfective otitis externa, left ear: Secondary | ICD-10-CM | POA: Insufficient documentation

## 2021-03-22 DIAGNOSIS — H9202 Otalgia, left ear: Secondary | ICD-10-CM | POA: Diagnosis present

## 2021-03-22 MED ORDER — OFLOXACIN 0.3 % OT SOLN: 5.0000 [drp] | Freq: Two times a day (BID) | OTIC | 0 refills | Status: AC

## 2021-03-22 NOTE — Discharge Instructions (Signed)
You have an outer ear infection placed you on drops please take as prescribed.  You have an ear wick in place should fall on it is on its own do not attempt to put it back in if falls outs.  Do not put anything in your ears as this will increase your risk of continuing infection.  Follow-up with ENT and/or PCP for further evaluation  Come back to the emergency department if you develop chest pain, shortness of breath, severe abdominal pain, uncontrolled nausea, vomiting, diarrhea.

## 2021-03-22 NOTE — ED Triage Notes (Signed)
Pt with left ear pain and feels like it's clogged up. Pt cleaned her left ear with hydrogen peroxide on qtip, states she doesn't go deep.

## 2021-03-22 NOTE — ED Provider Notes (Signed)
[facility] Provider Note   CSN: [id number] Arrival date & time: 03/22/21  1654     History  No chief complaint on file.   Bonnie Jones is a 41 y.o. female.  HPI  Patient without pertinent medical history presents with complaints of left ear pain, ear pain started on Saturday, started few hours after she was cleaning out her ear.  She states that she was using a Q-tip dipped in hydrogen peroxide and was cleaning it when a few hours later she developed pain.  She denies any drainage or discharge from the area, has decreased hearing, describes a throbbing sensation, no lightheaded dizziness fevers or chills, no congestion cough general body aches.  She has no other complaints at this time.  Home Medications Prior to Admission medications   Medication Sig Start Date End Date Taking? Authorizing Provider  ofloxacin (FLOXIN) 0.3 % OTIC solution Place 5 drops into the left ear 2 (two) times daily for 7 days. 03/22/21 03/29/21 Yes Marcello Fennel, PA-C  famotidine (PEPCID) 20 MG tablet Take 20 mg by mouth at bedtime. 11/25/18   [provider]  megestrol (MEGACE) 40 MG tablet 3 tablets a day for 5 days, 2 tablets a day for 5 days then 1 tablet daily 12/29/18   Florian Buff, MD  valACYclovir (VALTREX) 500 MG tablet Take 500 mg by mouth 2 (two) times daily as needed. 11/25/18   [provider]      Allergies    Patient has no known allergies.    Review of Systems   Review of Systems  Constitutional:  Negative for chills and fever.  HENT:  Positive for ear pain and hearing loss. Negative for ear discharge.   Respiratory:  Negative for shortness of breath.   Cardiovascular:  Negative for chest pain.  Gastrointestinal:  Negative for abdominal pain.  Neurological:  Negative for headaches.   Physical Exam Updated Vital Signs BP (!) 189/118 (BP Location: Right Arm)    Pulse 76    Temp 98.4 F (36.9 C) (Oral)    Resp 17    Ht 5\' 5"  (1.651 m)     Wt 99.8 kg    LMP 03/10/2021    SpO2 100%    BMI 36.61 kg/m  Physical Exam Vitals and nursing note reviewed.  Constitutional:      General: She is not in acute distress.    Appearance: She is not ill-appearing.  HENT:     Head: Normocephalic and atraumatic.     Right Ear: Tympanic membrane, ear canal and external ear normal.     Ears:     Comments: There is no ear protrusion, no tenderness behind the mastoids, right ear TMs were nonbulging without signs of infection ear canal without any abnormalities.  Left ear had edema within the ear canal itself, some slight drainage present, TMs were difficult to fully assess but there is no bulging or erythema noted during my limited exam.    Nose: No congestion.  Eyes:     Conjunctiva/sclera: Conjunctivae normal.  Cardiovascular:     Rate and Rhythm: Normal rate and regular rhythm.     Pulses: Normal pulses.  Pulmonary:     Effort: Pulmonary effort is normal.  Skin:    General: Skin is warm and dry.  Neurological:     Mental Status: She is alert.  Psychiatric:        Mood and Affect: Mood normal.    ED Results /  Procedures / Treatments   Labs (all labs ordered are listed, but only abnormal results are displayed) Labs Reviewed - No data to display  EKG None  Radiology No results found.  Procedures Procedures    Medications Ordered in ED Medications - No data to display  ED Course/ Medical Decision Making/ A&P                           Medical Decision Making  This patient presents to the ED for concern of left ear pain, this involves an extensive number of treatment options, and is a complaint that carries with it a high risk of complications and morbidity.  The differential diagnosis includes mastoiditis, otitis media, otitis externa ruptured eardrum    Additional history obtained:  Additional history obtained from N/A    Co morbidities that complicate the patient evaluation  N/A  Social Determinants of  Health:  N/A    Lab Tests:  I Ordered, and personally interpreted labs.  The pertinent results include:  N/A   Imaging Studies ordered:  I ordered imaging studies including N/A      Reevaluation:  Exam was consistent with otitis externa, extremely swollen on my exam, will recommend earwick, patient agreed on this plan 1 earwick was placed in the left ear and tolerated the procedure well.  She is agreeable for discharge.   Rule out Low suspicion for mastoiditis as there is no ear protrusion, no tenderness behind the mastoid, no overlying skin changes present.  Low suspicion otitis medium or ruptured TM asTMs were intact bilaterally, no bulging no erythema present.    Dispostion and problem list  After consideration of the diagnostic results and the patients response to treatment, I feel that the patent would benefit from discharge.  Otitis externa-patient has ear wick in place, will place her on drops, follow-up with PCP and/or ENT for further evaluation.            Final Clinical Impression(s) / ED Diagnoses Final diagnoses:  Acute otitis externa of left ear, unspecified type    Rx / DC Orders ED Discharge Orders          Ordered    ofloxacin (FLOXIN) 0.3 % OTIC solution  2 times daily        03/22/21 2056              Aron Baba 03/22/21 2057    Hayden Rasmussen, MD 03/23/21 1128

## 2021-04-19 ENCOUNTER — Encounter: Payer: Medicaid Other | Admitting: Adult Health

## 2021-04-19 ENCOUNTER — Encounter: Payer: Self-pay | Admitting: Adult Health

## 2021-04-19 ENCOUNTER — Other Ambulatory Visit: Payer: Self-pay

## 2021-04-19 ENCOUNTER — Other Ambulatory Visit (HOSPITAL_COMMUNITY)
Admission: RE | Admit: 2021-04-19 | Discharge: 2021-04-19 | Disposition: A | Discharge status: Home / Self Care | Payer: Medicaid Other | Source: Ambulatory Visit | Attending: Adult Health | Admitting: Adult Health

## 2021-04-19 ENCOUNTER — Ambulatory Visit: Payer: Medicaid Other | Admitting: Adult Health

## 2021-04-19 VITALS — BP 169/92 | HR 83 | Ht 65.5 in | Wt 222.0 lb

## 2021-04-19 DIAGNOSIS — I10 Essential (primary) hypertension: Secondary | ICD-10-CM

## 2021-04-19 DIAGNOSIS — N898 Other specified noninflammatory disorders of vagina: Secondary | ICD-10-CM | POA: Diagnosis not present

## 2021-04-19 DIAGNOSIS — F17200 Nicotine dependence, unspecified, uncomplicated: Secondary | ICD-10-CM | POA: Insufficient documentation

## 2021-04-19 MED ORDER — NYSTATIN-TRIAMCINOLONE 100000-0.1 UNIT/GM-% EX OINT: 1.0000 "application " | TOPICAL_OINTMENT | Freq: Two times a day (BID) | CUTANEOUS | 0 refills | Status: DC

## 2021-04-19 MED ORDER — FLUCONAZOLE 150 MG PO TABS: ORAL_TABLET | ORAL | 1 refills | Status: DC

## 2021-04-19 NOTE — Progress Notes (Signed)
?Subjective:  ?  ? Patient ID: Bonnie Jones, female   DOB: [birth year], 41 y.o.   MRN: [id number] ? ?HPI ?Bonnie Jones is a 41 year old female, single, [id number] in for complaining of vaginal itching. Referred by Bonnie Jones. ?She had pap 11/17/20 she says. ?Periods heavy for about 4 days, not using megace. ?She works from home. ? ?PCP is Bonnie Jones. ? ?Review of Systems ?+vaginal itching esp at clitoris and left labia ?Periods heavy about 4 days  ?No sex in years  ?Reviewed past medical,surgical, social and family history. Reviewed medications and allergies.  ?   ?Objective:  ? Physical Exam ?BP (!) 169/92 (BP Location: Left Arm, Cuff Size: Large)   Pulse 83   Ht 5' 5.5" (1.664 m)   Wt 222 lb (100.7 kg)   LMP 04/12/2021 (Exact Date)   BMI 36.38 kg/m?   ?  Skin warm and dry.Pelvic: external genitalia is normal in appearance no lesions, vagina: white discharge without odor,urethra has no lesions or masses noted, cervix:smooth and bulbous, uterus: normal size, shape and contour, non tender, no masses felt, adnexa: no masses or tenderness noted. Bladder is non tender and no masses felt.CV swab obtained.  ?AA is 2 ?Fall risk is low ?Depression screen Bonnie Jones 2/9 04/19/2021  ?Decreased Interest 0  ?Down, Depressed, Hopeless 0  ?PHQ - 2 Score 0  ?Altered sleeping 2  ?Tired, decreased energy 0  ?Change in appetite 0  ?Feeling bad or failure about yourself  0  ?Trouble concentrating 0  ?Moving slowly or fidgety/restless 0  ?Suicidal thoughts 0  ?PHQ-9 Score 2  ?  ?GAD 7 : Generalized Anxiety Score 04/19/2021  ?Nervous, Anxious, on Jones 0  ?Control/stop worrying 0  ?Worry too much - different things 0  ?Trouble relaxing 0  ?Restless 0  ?Easily annoyed or irritable 0  ?Afraid - awful might happen 0  ?Total GAD 7 Score 0  ? ?  ? [facility] - 04/19/21 1603   ? ?  ? Pregnancy Intention Screening  ? Does the patient want to become pregnant in the next year? N/A   ? Does the patient's partner want to become pregnant in the next year? N/A   ?  Would the patient like to discuss contraceptive options today? N/A   ?  ? Contraception Wrap Up  ? Current Method Female Sterilization   ? End Method Female Sterilization   ? Contraception Counseling Provided No   ? ?  ?  ? ?  ?  ?Assessment:  ?   ?1. Vaginal itching ?Will rx diflucan and mycolog ?Meds ordered this encounter  ?Medications  ? fluconazole (DIFLUCAN) 150 MG tablet  ?  Sig: Take 1 now and 1 in 3 days  ?  Dispense:  2 tablet  ?  Refill:  1  ?  Order Specific Question:   Supervising Provider  ?  Answer:   Bonnie Jones [[id number]]  ? nystatin-triamcinolone ointment (MYCOLOG)  ?  Sig: Apply 1 application. topically 2 (two) times daily.  ?  Dispense:  30 g  ?  Refill:  0  ?  Order Specific Question:   Supervising Provider  ?  Answer:   Bonnie Jones [[id number]]  ?  ? ?2. Vaginal discharge ?CV swab sent for BV and yeast to rule out BV and yeast  ? ?3. Benign essential hypertension ?On Benicar 40 mg daily ?Follow up with PCP ?Try to be more active and lose some weight ? ?4. Smoker ?Try to decrease  ?   ?  Plan:  ?   ?Follow up with me in about 12 days for recheck  ?   ?

## 2021-04-21 ENCOUNTER — Other Ambulatory Visit: Payer: Self-pay | Admitting: Adult Health

## 2021-04-21 LAB — CERVICOVAGINAL ANCILLARY ONLY
Bacterial Vaginitis (gardnerella): POSITIVE — AB
Candida Glabrata: POSITIVE — AB
Candida Vaginitis: NEGATIVE
Comment: NEGATIVE
Comment: NEGATIVE
Comment: NEGATIVE

## 2021-04-21 MED ORDER — METRONIDAZOLE 500 MG PO TABS: 500.0000 mg | ORAL_TABLET | Freq: Two times a day (BID) | ORAL | 0 refills | Status: DC

## 2021-04-21 NOTE — Progress Notes (Signed)
Vaginal swab +BV and yeast, already treated for yeast, will rx flagyl for BV, no sex or alcohol while taking meds  ?

## 2021-05-01 ENCOUNTER — Encounter: Payer: Self-pay | Admitting: Adult Health

## 2021-05-01 ENCOUNTER — Other Ambulatory Visit: Payer: Self-pay

## 2021-05-01 ENCOUNTER — Ambulatory Visit: Payer: Medicaid Other | Admitting: Adult Health

## 2021-05-01 VITALS — BP 152/76 | HR 80 | Ht 65.5 in | Wt 226.0 lb

## 2021-05-01 DIAGNOSIS — N898 Other specified noninflammatory disorders of vagina: Secondary | ICD-10-CM | POA: Diagnosis not present

## 2021-05-01 DIAGNOSIS — B9689 Other specified bacterial agents as the cause of diseases classified elsewhere: Secondary | ICD-10-CM | POA: Diagnosis not present

## 2021-05-01 DIAGNOSIS — N760 Acute vaginitis: Secondary | ICD-10-CM | POA: Insufficient documentation

## 2021-05-01 DIAGNOSIS — B379 Candidiasis, unspecified: Secondary | ICD-10-CM

## 2021-05-01 NOTE — Progress Notes (Signed)
?  Subjective:  ?  ? Patient ID: Bonnie Jones, female   DOB: Apr 17, 1980, 41 y.o.   MRN: [id number] ? ?HPI ?Bonnie Jones is 41 year old black female,single, G3P2012 back in follow up on vaginal itching and discharge, and is a little better. CV swab was positive for BV and glabrata, she took diflucan but not flagyl. ?PCP is Bonnie Jones ? ?Review of Systems ?Vaginal itching a little better, but still there ?Reviewed past medical,surgical, social and family history. Reviewed medications and allergies.  ?   ?Objective:  ? Physical Exam ?BP (!) 152/76 (BP Location: Right Arm, Patient Position: Sitting, Cuff Size: Normal)   Pulse 80   Ht 5' 5.5" (1.664 m)   Wt 226 lb (102.5 kg)   LMP 04/12/2021 (Exact Date)   BMI 37.04 kg/m?   ?  Skin warm and dry.Pelvic: external genitalia is normal in appearance,has 2 flat skin tags left groin area, vagina: white discharge with odor,urethra has no lesions or masses noted, cervix:smooth and bulbous, uterus: normal size, shape and contour, non tender, no masses felt, adnexa: no masses or tenderness noted. Bladder is non tender and no masses felt.  ? [facility] - 05/01/21 1626   ? ?  ? Pregnancy Intention Screening  ? Does the patient want to become pregnant in the next year? N/A   ? Does the patient's partner want to become pregnant in the next year? N/A   ? Would the patient like to discuss contraceptive options today? N/A   ?  ? Contraception Wrap Up  ? Current Method Female Sterilization   ? End Method Female Sterilization   ? Contraception Counseling Provided No   ? ?  ?  ? ?  ? Examination chaperoned by Bonnie Squibb LPN ? ?Assessment:  ?   ?1. Vaginal itching ? ?2. Vaginal discharge ? ?3. BV (bacterial vaginosis) ?To get flagyl 500 mg 1 bid x 7daysl and start today, no sex or alcohol ?Rx is ready at [facility]  ? ? ?4. Candida glabrata infection ?Will get refill on diflucan and take again ?   ?Plan:  ?   ?Will recheck in 2 weeks  ?   ?

## 2021-05-16 ENCOUNTER — Ambulatory Visit: Payer: Medicaid Other | Admitting: Adult Health

## 2021-05-23 ENCOUNTER — Ambulatory Visit: Payer: Medicaid Other | Admitting: Adult Health

## 2021-06-07 ENCOUNTER — Ambulatory Visit: Payer: Medicaid Other | Admitting: Adult Health

## 2021-06-08 ENCOUNTER — Ambulatory Visit: Payer: Medicaid Other | Admitting: Adult Health

## 2021-08-27 ENCOUNTER — Other Ambulatory Visit: Payer: Self-pay

## 2021-08-27 ENCOUNTER — Encounter (HOSPITAL_COMMUNITY): Payer: Self-pay

## 2021-08-27 ENCOUNTER — Emergency Department (HOSPITAL_COMMUNITY)
Admission: EM | Admit: 2021-08-27 | Discharge: 2021-08-27 | Disposition: A | Discharge status: Home / Self Care | Payer: No Typology Code available for payment source | Attending: Emergency Medicine | Admitting: Emergency Medicine

## 2021-08-27 ENCOUNTER — Emergency Department (HOSPITAL_COMMUNITY): Payer: No Typology Code available for payment source

## 2021-08-27 DIAGNOSIS — Z7951 Long term (current) use of inhaled steroids: Secondary | ICD-10-CM | POA: Diagnosis not present

## 2021-08-27 DIAGNOSIS — M545 Low back pain, unspecified: Secondary | ICD-10-CM

## 2021-08-27 DIAGNOSIS — I10 Essential (primary) hypertension: Secondary | ICD-10-CM | POA: Diagnosis not present

## 2021-08-27 DIAGNOSIS — Z79899 Other long term (current) drug therapy: Secondary | ICD-10-CM | POA: Diagnosis not present

## 2021-08-27 DIAGNOSIS — R109 Unspecified abdominal pain: Secondary | ICD-10-CM | POA: Insufficient documentation

## 2021-08-27 DIAGNOSIS — J45909 Unspecified asthma, uncomplicated: Secondary | ICD-10-CM | POA: Insufficient documentation

## 2021-08-27 LAB — URINALYSIS, ROUTINE W REFLEX MICROSCOPIC
Bacteria, UA: NONE SEEN
Bilirubin Urine: NEGATIVE
Glucose, UA: NEGATIVE mg/dL
Ketones, ur: NEGATIVE mg/dL
Leukocytes,Ua: NEGATIVE
Nitrite: NEGATIVE
Protein, ur: 100 mg/dL — AB
RBC / HPF: >50 RBC/hpf — ABNORMAL HIGH (ref 0–5)
Specific Gravity, Urine: 1.024 (ref 1.005–1.030)
pH: 8 (ref 5.0–8.0)

## 2021-08-27 LAB — PREGNANCY, URINE: Preg Test, Ur: NEGATIVE

## 2021-08-27 MED ORDER — PREDNISONE 20 MG PO TABS: 40.0000 mg | ORAL_TABLET | Freq: Every day | ORAL | 0 refills | Status: DC

## 2021-08-27 MED ORDER — METHOCARBAMOL 500 MG PO TABS: 500.0000 mg | ORAL_TABLET | Freq: Three times a day (TID) | ORAL | 0 refills | Status: DC | PRN

## 2021-08-27 NOTE — ED Provider Notes (Signed)
[facility] EMERGENCY DEPARTMENT Provider Note   CSN: [id number] Arrival date & time: 08/27/21  2014     History {Add pertinent medical, surgical, social history, OB history to HPI:1} Chief Complaint  Patient presents with  . Back Pain    Bonnie Jones is a 41 y.o. female.   Back Pain     Past Medical History:  Diagnosis Date  . Abnormal Pap smear   . Asthma    as child  . GERD (gastroesophageal reflux disease)   . Hypertension   . Pregnant   . Skin infection 09/16/2012  . Weight decrease    Past Surgical History:  Procedure Laterality Date  . BACK SURGERY    . CHOLECYSTECTOMY N/A 10/16/2013   Procedure: LAPAROSCOPIC CHOLECYSTECTOMY;  Surgeon: Jamesetta So, MD;  Location: [facility];  Service: General;  Laterality: N/A;  . MASS EXCISION  02/02/2011   Procedure: EXCISION MASS;  Surgeon: Belva Crome, MD;  Location: [facility];  Service: General;  Laterality: Left;  Excision of LEFT UPPER BACK MASS  . TONSILLECTOMY    . TONSILLECTOMY AND ADENOIDECTOMY    . TUBAL LIGATION N/A 08/23/2012   Procedure: POST PARTUM TUBAL LIGATION with filshie clips and removal of nevi;  Surgeon: Donnamae Jude, MD;  Location: [facility];  Service: Gynecology;  Laterality: N/A;     Home Medications Prior to Admission medications   Medication Sig Start Date End Date Taking? Authorizing Provider  clotrimazole-betamethasone (LOTRISONE) cream SMARTSIG:Topical Morning-Evening 03/03/21   [provider]  famotidine (PEPCID) 20 MG tablet Take 20 mg by mouth at bedtime. 11/25/18   [provider]  fluconazole (DIFLUCAN) 150 MG tablet Take 1 now and 1 in 3 days Patient not taking: Reported on 05/01/2021 04/19/21   Estill Dooms, NP  megestrol (MEGACE) 40 MG tablet 3 tablets a day for 5 days, 2 tablets a day for 5 days then 1 tablet daily Patient not taking: Reported on 04/19/2021 12/29/18   Florian Buff, MD  metroNIDAZOLE (FLAGYL) 500 MG tablet Take 1 tablet (500 mg total) by mouth 2  (two) times daily. Patient not taking: Reported on 05/01/2021 04/21/21   Estill Dooms, NP  nystatin-triamcinolone ointment [facility]) Apply 1 application. topically 2 (two) times daily. 04/19/21   Estill Dooms, NP  olmesartan (BENICAR) 40 MG tablet Take 40 mg by mouth daily. 03/30/21   [provider]  valACYclovir (VALTREX) 500 MG tablet Take 500 mg by mouth 2 (two) times daily as needed. 11/25/18   [provider]      Allergies    Patient has no known allergies.    Review of Systems   Review of Systems  Musculoskeletal:  Positive for back pain.    Physical Exam Updated Vital Signs BP (!) 156/88 (BP Location: Right Arm)   Pulse 71   Temp 99 F (37.2 C) (Oral)   Resp 18   Ht 5' 5.5" (1.664 m)   Wt 99.8 kg   SpO2 100%   BMI 36.05 kg/m  Physical Exam  ED Results / Procedures / Treatments   Labs (all labs ordered are listed, but only abnormal results are displayed) Labs Reviewed - No data to display  EKG None  Radiology No results found.  Procedures Procedures  {Document cardiac monitor, telemetry assessment procedure when appropriate:1}  Medications Ordered in ED Medications - No data to display  ED Course/ Medical Decision Making/ A&P  Medical Decision Making  ***  {Document critical care time when appropriate:1} {Document review of labs and clinical decision tools ie heart score, Chads2Vasc2 etc:1}  {Document your independent review of radiology images, and any outside records:1} {Document your discussion with family members, caretakers, and with consultants:1} {Document social determinants of health affecting pt's care:1} {Document your decision making why or why not admission, treatments were needed:1} Final Clinical Impression(s) / ED Diagnoses Final diagnoses:  None    Rx / DC Orders ED Discharge Orders     None

## 2021-08-27 NOTE — ED Notes (Signed)
Pt attempted to urinate and stated she could not produce urine at this time.

## 2021-08-27 NOTE — ED Notes (Signed)
Patient transported to CT 

## 2021-08-27 NOTE — ED Triage Notes (Signed)
Pt reports back pain x5 days. No injury.

## 2021-09-18 ENCOUNTER — Other Ambulatory Visit (HOSPITAL_COMMUNITY): Payer: Self-pay | Admitting: Internal Medicine

## 2021-09-18 DIAGNOSIS — Z1231 Encounter for screening mammogram for malignant neoplasm of breast: Secondary | ICD-10-CM

## 2021-09-27 ENCOUNTER — Ambulatory Visit (HOSPITAL_COMMUNITY): Payer: No Typology Code available for payment source

## 2021-10-09 ENCOUNTER — Ambulatory Visit (HOSPITAL_COMMUNITY)
Admission: RE | Admit: 2021-10-09 | Discharge: 2021-10-09 | Disposition: A | Discharge status: Home / Self Care | Payer: No Typology Code available for payment source | Source: Ambulatory Visit | Attending: Internal Medicine | Admitting: Internal Medicine

## 2021-10-09 DIAGNOSIS — Z1231 Encounter for screening mammogram for malignant neoplasm of breast: Secondary | ICD-10-CM | POA: Diagnosis present

## 2021-10-17 ENCOUNTER — Encounter: Payer: Self-pay | Admitting: Adult Health

## 2021-10-17 ENCOUNTER — Other Ambulatory Visit (HOSPITAL_COMMUNITY)
Admission: RE | Admit: 2021-10-17 | Discharge: 2021-10-17 | Disposition: A | Discharge status: Home / Self Care | Payer: No Typology Code available for payment source | Source: Ambulatory Visit | Attending: Adult Health | Admitting: Adult Health

## 2021-10-17 ENCOUNTER — Ambulatory Visit (INDEPENDENT_AMBULATORY_CARE_PROVIDER_SITE_OTHER): Payer: No Typology Code available for payment source | Admitting: Adult Health

## 2021-10-17 VITALS — BP 157/92 | HR 63 | Ht 65.5 in | Wt 215.5 lb

## 2021-10-17 DIAGNOSIS — L292 Pruritus vulvae: Secondary | ICD-10-CM

## 2021-10-17 DIAGNOSIS — Z124 Encounter for screening for malignant neoplasm of cervix: Secondary | ICD-10-CM | POA: Diagnosis not present

## 2021-10-17 DIAGNOSIS — F17200 Nicotine dependence, unspecified, uncomplicated: Secondary | ICD-10-CM | POA: Diagnosis not present

## 2021-10-17 DIAGNOSIS — I10 Essential (primary) hypertension: Secondary | ICD-10-CM | POA: Diagnosis not present

## 2021-10-17 DIAGNOSIS — L900 Lichen sclerosus et atrophicus: Secondary | ICD-10-CM | POA: Insufficient documentation

## 2021-10-17 MED ORDER — CLOBETASOL PROPIONATE 0.05 % EX CREA: TOPICAL_CREAM | CUTANEOUS | 2 refills | Status: DC

## 2021-10-17 MED ORDER — VALACYCLOVIR HCL 500 MG PO TABS: 500.0000 mg | ORAL_TABLET | Freq: Two times a day (BID) | ORAL | 2 refills | Status: AC

## 2021-10-17 NOTE — Progress Notes (Signed)
  Subjective:     Patient ID: Bonnie Jones, female   DOB: 1980-11-28, 41 y.o.   MRN: [id number]  HPI Aqua is a 41 year old female,single, G3P2012 in complaining of vulva itching. And wants to get pap. She was treated for BV and candida glabrata in March. She has tried boric acid without relief.  PCP is Dr Gerarda Fraction.  Review of Systems Has vulva itching No sex in 2 years Reviewed past medical,surgical, social and family history. Reviewed medications and allergies.     Objective:   Physical Exam BP (!) 157/92 (BP Location: Left Arm, Patient Position: Sitting, Cuff Size: Normal) Comment: Simultaneous filing. User may not have seen previous data. Comment (BP Location): Simultaneous filing. User may not have seen previous data. Comment (Cuff Size): Simultaneous filing. User may not have seen previous data.  Pulse 63   Ht 5' 5.5" (1.664 m)   Wt 215 lb 8 oz (97.8 kg)   LMP 10/12/2021   BMI 35.32 kg/m     Skin warm and dry.Pelvic: external genitalia is normal in appearance no lesions,but vulva has thickened areas, vagina:pink, no odor,urethra has no lesions or masses noted, cervix:smooth and bulbous, Pap with HR HPV genotyping and GC/CHL performed at her request,uterus: normal size, shape and contour, non tender, no masses felt, adnexa: no masses or tenderness noted. Bladder is non tender and no masses felt.  Upstream - 10/17/21 1618       Pregnancy Intention Screening   Does the patient want to become pregnant in the next year? No    Does the patient's partner want to become pregnant in the next year? No    Would the patient like to discuss contraceptive options today? No      Contraception Wrap Up   Current Method Female Sterilization    End Method Female Sterilization            Examination chaperoned by Levy Pupa LPN  Assessment:     1. Routine cervical smear Pap sent Pap in 3 years if normal   2. Smoker Will try to cut down, does not want meds   3. Vulvar  itching CV swab sent for BV and yeast   4. Benign essential hypertension Take BP meds and follow up with PCP  5. Lichen sclerosus Vulva is has thickened areas  Will rx temovate to use bid for 2 weeks then 2 x weekly  Meds ordered this encounter  Medications   clobetasol cream (TEMOVATE) 0.05 %    Sig: Apply twice daily for 2 weeks then twice daily 2 x weekly    Dispense:  45 g    Refill:  2    Order Specific Question:   Supervising Provider    Answer:   Tania Ade H [[id number]]   valACYclovir (VALTREX) 500 MG tablet    Sig: Take 1 tablet (500 mg total) by mouth 2 (two) times daily.    Dispense:  20 tablet    Refill:  2    Order Specific Question:   Supervising Provider    Answer:   Tania Ade H [[id number]]   Review hand out on Lichen sclerosus   She requested refill on valtrex to have.  Plan:     Follow up in 4 weeks for recheck

## 2021-10-19 LAB — CERVICOVAGINAL ANCILLARY ONLY
Bacterial Vaginitis (gardnerella): NEGATIVE
Candida Glabrata: NEGATIVE
Candida Vaginitis: NEGATIVE
Comment: NEGATIVE
Comment: NEGATIVE
Comment: NEGATIVE

## 2021-10-23 LAB — CYTOLOGY - PAP
Chlamydia: NEGATIVE
Comment: NEGATIVE
Comment: NEGATIVE
Comment: NORMAL
Diagnosis: NEGATIVE
High risk HPV: NEGATIVE
Neisseria Gonorrhea: NEGATIVE

## 2021-11-14 ENCOUNTER — Ambulatory Visit: Payer: No Typology Code available for payment source | Admitting: Adult Health

## 2021-11-23 ENCOUNTER — Ambulatory Visit (INDEPENDENT_AMBULATORY_CARE_PROVIDER_SITE_OTHER): Payer: No Typology Code available for payment source | Admitting: Adult Health

## 2021-11-23 ENCOUNTER — Encounter: Payer: Self-pay | Admitting: Adult Health

## 2021-11-23 VITALS — BP 137/76 | HR 67 | Ht 65.5 in | Wt 215.0 lb

## 2021-11-23 DIAGNOSIS — Z6835 Body mass index (BMI) 35.0-35.9, adult: Secondary | ICD-10-CM | POA: Diagnosis not present

## 2021-11-23 DIAGNOSIS — L900 Lichen sclerosus et atrophicus: Secondary | ICD-10-CM | POA: Diagnosis not present

## 2021-11-23 DIAGNOSIS — Z713 Dietary counseling and surveillance: Secondary | ICD-10-CM

## 2021-11-23 DIAGNOSIS — Z7689 Persons encountering health services in other specified circumstances: Secondary | ICD-10-CM

## 2021-11-23 MED ORDER — PHENTERMINE HCL 37.5 MG PO TABS: 37.5000 mg | ORAL_TABLET | Freq: Every day | ORAL | 0 refills | Status: DC

## 2021-11-23 MED ORDER — MEGESTROL ACETATE 40 MG PO TABS: 40.0000 mg | ORAL_TABLET | Freq: Every day | ORAL | 0 refills | Status: DC

## 2021-11-23 NOTE — Progress Notes (Signed)
  Subjective:     Patient ID: Bonnie Jones, female   DOB: [birth year], 41 y.o.   MRN: [id number]  HPI Bonnie Jones is a 41 year old female,single, G3P2012, back in follow up on vulva itching and LS, and feel much better, after using temovate. She requests adipex for helping to lose weight has used in the past. She is leaving on cruise next week and requests megace to stop period.  Last pap was negative malignancy and HPV 10/15/21.  PCP is Dr Gerarda Fraction.   Review of Systems Vulva itching has resolved Reviewed past medical,surgical, social and family history. Reviewed medications and allergies.     Objective:   Physical Exam BP 137/76 (BP Location: Left Arm, Patient Position: Sitting, Cuff Size: Normal)   Pulse 67   Ht 5' 5.5" (1.664 m)   Wt 215 lb (97.5 kg)   LMP 11/05/2021   BMI 35.23 kg/m     Skin warm and dry.Pelvic: external genitalia is normal in appearance, vulva thickness resolved, has skin vs mole left groin.  Upstream - 11/23/21 0931       Pregnancy Intention Screening   Does the patient want to become pregnant in the next year? No    Does the patient's partner want to become pregnant in the next year? No    Would the patient like to discuss contraceptive options today? No      Contraception Wrap Up   Current Method Female Sterilization    End Method Female Sterilization            Examination chaperoned by Levy Pupa LPN  Assessment:      1. Lichen sclerosus Vulva itching has resolved, as has the thickened areas Has temovate can use twice a week if needed   2. Weight loss counseling, encounter for Increase water and exercise Watch carbs, will rx adipex, will not start til after cruise  Meds ordered this encounter  Medications   megestrol (MEGACE) 40 MG tablet    Sig: Take 1 tablet (40 mg total) by mouth daily.    Dispense:  60 tablet    Refill:  0    Order Specific Question:   Supervising Provider    Answer:   Tania Ade H [[id number]]   phentermine  (ADIPEX-P) 37.5 MG tablet    Sig: Take 1 tablet (37.5 mg total) by mouth daily before breakfast. Take 1 daily    Dispense:  30 tablet    Refill:  0    Order Specific Question:   Supervising Provider    Answer:   EURE, LUTHER H [[id number]]     3. Body mass index 35.0-35.9, adult   4. Encounter for menstrual regulation Will rx megace to stop period while on cruise    Plan:     Follow up in 5 weeks with me for weight and BP check

## 2022-04-15 ENCOUNTER — Other Ambulatory Visit: Payer: Self-pay | Admitting: Adult Health

## 2022-08-03 ENCOUNTER — Emergency Department (HOSPITAL_COMMUNITY)
Admission: EM | Admit: 2022-08-03 | Discharge: 2022-08-03 | Disposition: A | Discharge status: Home / Self Care | Payer: Medicaid Other | Attending: Emergency Medicine | Admitting: Emergency Medicine

## 2022-08-03 ENCOUNTER — Encounter (HOSPITAL_COMMUNITY): Payer: Self-pay

## 2022-08-03 ENCOUNTER — Emergency Department (HOSPITAL_COMMUNITY): Payer: Medicaid Other

## 2022-08-03 ENCOUNTER — Other Ambulatory Visit: Payer: Self-pay

## 2022-08-03 DIAGNOSIS — J45909 Unspecified asthma, uncomplicated: Secondary | ICD-10-CM | POA: Insufficient documentation

## 2022-08-03 DIAGNOSIS — M25561 Pain in right knee: Secondary | ICD-10-CM | POA: Insufficient documentation

## 2022-08-03 DIAGNOSIS — I10 Essential (primary) hypertension: Secondary | ICD-10-CM | POA: Diagnosis not present

## 2022-08-03 DIAGNOSIS — M25562 Pain in left knee: Secondary | ICD-10-CM | POA: Insufficient documentation

## 2022-08-03 MED ORDER — PREDNISONE 50 MG PO TABS
60.0000 mg | ORAL_TABLET | Freq: Once | ORAL | Status: AC
  Administered 2022-08-03: 60 mg via ORAL
  Filled 2022-08-03: qty 1

## 2022-08-03 MED ORDER — PREDNISONE 10 MG PO TABS: 40.0000 mg | ORAL_TABLET | Freq: Every day | ORAL | 0 refills | Status: DC

## 2022-08-03 NOTE — ED Triage Notes (Addendum)
Patient complains of knee pain for about the past two weeks and has been using icy hot and ibuprofen without relief and gradually increasing. The knee hurts when standing or sitting, and mostly when bending with pressure while at an angle.

## 2022-08-03 NOTE — ED Provider Notes (Addendum)
[place] EMERGENCY DEPARTMENT AT [facility] Provider Note   CSN: [id number] Arrival date & time: 08/03/22  4098     History  Chief Complaint  Patient presents with   Knee Pain   Back Pain    Bonnie Jones is a 42 y.o. female.  Patient with a complaint of bilateral knee pain and some swelling for 3 weeks.  No fall no injury.  No known history of arthritis.  Has a history of some back problems.  Patient denies any redness to it does state that the left is a little bit worse than the right.  Patient has been treating it with IcyHot and ibuprofen without relief.  Past medical history of hypertension asthma.  Past surgical history just significant for tonsillectomy and gallbladder removal in 2015 and tubal ligation in 2014.  Patient is an everyday smoker.       Home Medications Prior to Admission medications   Medication Sig Start Date End Date Taking? Authorizing Provider  clobetasol cream (TEMOVATE) 0.05 % Apply twice daily for 2 weeks then twice daily 2 x weekly 10/17/21   Adline Potter, NP  megestrol (MEGACE) 40 MG tablet TAKE 1 TABLET BY MOUTH EVERY DAY 04/16/22   Adline Potter, NP  olmesartan (BENICAR) 40 MG tablet Take 40 mg by mouth daily. 03/30/21   [provider]  phentermine (ADIPEX-P) 37.5 MG tablet Take 1 tablet (37.5 mg total) by mouth daily before breakfast. Take 1 daily 11/23/21   Adline Potter, NP  valACYclovir (VALTREX) 500 MG tablet Take 1 tablet (500 mg total) by mouth 2 (two) times daily. 10/17/21   Adline Potter, NP      Allergies    Patient has no known allergies.    Review of Systems   Review of Systems  Constitutional:  Negative for chills and fever.  HENT:  Negative for ear pain and sore throat.   Eyes:  Negative for pain and visual disturbance.  Respiratory:  Negative for cough and shortness of breath.   Cardiovascular:  Negative for chest pain and palpitations.  Gastrointestinal:  Negative for abdominal  pain and vomiting.  Genitourinary:  Negative for dysuria and hematuria.  Musculoskeletal:  Positive for joint swelling. Negative for arthralgias and back pain.  Skin:  Negative for color change and rash.  Neurological:  Negative for seizures and syncope.  All other systems reviewed and are negative.   Physical Exam Updated Vital Signs BP (!) 145/80   Pulse 61   Temp 98 F (36.7 C) (Oral)   Resp 18   Ht 1.651 m (5\' 5" )   Wt 94.8 kg   SpO2 100%   BMI 34.78 kg/m  Physical Exam Vitals and nursing note reviewed.  Constitutional:      General: She is not in acute distress.    Appearance: Normal appearance. She is well-developed.  HENT:     Head: Normocephalic and atraumatic.  Eyes:     Conjunctiva/sclera: Conjunctivae normal.  Cardiovascular:     Rate and Rhythm: Normal rate and regular rhythm.     Heart sounds: No murmur heard. Pulmonary:     Effort: Pulmonary effort is normal. No respiratory distress.     Breath sounds: Normal breath sounds.  Abdominal:     Palpations: Abdomen is soft.     Tenderness: There is no abdominal tenderness.  Musculoskeletal:        General: Tenderness present. No swelling.     Cervical back: Normal range  of motion and neck supple.     Comments: Bilateral knees left greater than right with some swelling and some tenderness maybe some slight increased warmth no erythema.  Distally neurovascularly intact.  No numbness to the top or bottom of the feet.  Skin:    General: Skin is warm and dry.     Capillary Refill: Capillary refill takes less than 2 seconds.  Neurological:     General: No focal deficit present.     Mental Status: She is alert and oriented to person, place, and time.  Psychiatric:        Mood and Affect: Mood normal.     ED Results / Procedures / Treatments   Labs (all labs ordered are listed, but only abnormal results are displayed) Labs Reviewed - No data to display  EKG None  Radiology No results  found.  Procedures Procedures    Medications Ordered in ED Medications - No data to display  ED Course/ Medical Decision Making/ A&P                             Medical Decision Making Amount and/or Complexity of Data Reviewed Radiology: ordered.  Risk Prescription drug management.   Will get x-rays of both knee.  Clinically seems to be consistent with inflammatory response to both knees.  Just could be undiagnosed arthritis.  X-rays of both knees right knee without any acute findings.  There is a small suprapatellar effusion on the left knee which may be overuse.  Will treat with prednisone first dose provided here and then a 5-day course.  She has follow-up next week with her primary care doctor on Tuesday.  Also gave her referral to orthopedics.  Patient's had some chronic back pain.  We did not address this here.  There is no evidence of sciatica or neurodeficits to the lower extremities.  Prednisone may help this as well.  Will have her primary care doctor evaluate further.  Is possible patient's has some type of connective tissue disorder that is undiagnosed.   Final Clinical Impression(s) / ED Diagnoses Final diagnoses:  Acute pain of both knees    Rx / DC Orders ED Discharge Orders     None         Vanetta Mulders, MD 08/03/22 0981    Vanetta Mulders, MD 08/03/22 [phone]

## 2022-08-03 NOTE — Discharge Instructions (Addendum)
Left knee x-ray shows evidence of some fluid on the knee.  That fits your exam clinically.  Recommend you follow-up with orthopedics.  Follow-up with your regular doctor.  Also follow-up with orthopedics information provided above.  Take the prednisone as directed.  First dose given here.  Start the next dose tomorrow.  Prescription sent to the [facility] pharmacy.  Work note provided as needed.

## 2022-08-24 ENCOUNTER — Ambulatory Visit: Payer: Medicaid Other | Admitting: Orthopedic Surgery

## 2022-08-24 ENCOUNTER — Encounter: Payer: Self-pay | Admitting: Orthopedic Surgery

## 2022-08-24 VITALS — BP 175/116 | HR 65 | Wt 205.0 lb

## 2022-08-24 DIAGNOSIS — M659 Synovitis and tenosynovitis, unspecified: Secondary | ICD-10-CM | POA: Diagnosis not present

## 2022-08-24 MED ORDER — PREDNISONE 10 MG PO TABS: 10.0000 mg | ORAL_TABLET | Freq: Three times a day (TID) | ORAL | 0 refills | Status: DC

## 2022-08-24 NOTE — Progress Notes (Signed)
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  She went to the ER she had x-rays done.  It was felt that she had some type of inflammatory condition in both knees and was put on prednisone orally and also given a shot of prednisone in the ER.  She says she felt better immediately but then when the medication wore off after several days the pain started to come back      Body mass index is 34.11 kg/m.   Problem list, medical hx, medications and allergies reviewed   Review of Systems  Constitutional:  Negative for fever.  Respiratory:  Negative for shortness of breath.   Cardiovascular:  Negative for chest pain.  Skin: Negative.   Neurological:  Negative for tingling and sensory change.     No Known Allergies  Wt 205 lb (93 kg)   BMI 34.11 kg/m    Physical exam: Physical Exam Vitals and nursing note reviewed.  Constitutional:      Appearance: Normal appearance.  HENT:     Head: Normocephalic and atraumatic.  Eyes:     General: No scleral icterus.       Right eye: No discharge.        Left eye: No discharge.     Extraocular Movements: Extraocular movements intact.     Conjunctiva/sclera: Conjunctivae normal.     Pupils: Pupils are equal, round, and reactive to light.  Cardiovascular:     Rate and Rhythm: Normal rate.     Pulses: Normal pulses.  Skin:    General: Skin is warm and dry.     Capillary Refill: Capillary refill takes less than 2 seconds.  Neurological:     General: No focal deficit present.     Mental Status: She is alert and oriented  to person, place, and time.  Psychiatric:        Mood and Affect: Mood normal.        Behavior: Behavior normal.        Thought Content: Thought content normal.        Judgment: Judgment normal.     MSK:  Bilateral knee exam  No issues with the skin no erythema no rash  Both knees move well she has trace joint effusions bilaterally without instability  She is tender really suprapatellar and the synovium.  It does not appear to be thickened however.    Data reviewed:   Radiographic imaging from the hospital.  My interpretation of those images is that the patient has no significant arthritis but trace joint effusions  Assessment and plan:  Encounter Diagnosis  Name Primary?   Synovitis of both knee joints Yes    Recommend 30 days of prednisone followed by transition to anti-inflammatory medication   Meds ordered this encounter  Medications   predniSONE (DELTASONE) 10 MG tablet    Sig: Take 1 tablet (10 mg total) by mouth 3 (three) times daily.    Dispense:  90 tablet  Refill:  0    Procedures:   no

## 2022-09-27 ENCOUNTER — Encounter: Payer: Self-pay | Admitting: Orthopedic Surgery

## 2022-09-27 ENCOUNTER — Ambulatory Visit: Payer: Medicaid Other | Admitting: Orthopedic Surgery

## 2022-09-27 VITALS — BP 151/90 | HR 71 | Ht 65.0 in | Wt 207.0 lb

## 2022-09-27 DIAGNOSIS — M542 Cervicalgia: Secondary | ICD-10-CM | POA: Diagnosis not present

## 2022-09-27 DIAGNOSIS — M659 Synovitis and tenosynovitis, unspecified: Secondary | ICD-10-CM

## 2022-09-27 MED ORDER — CELECOXIB 200 MG PO CAPS: 200.0000 mg | ORAL_CAPSULE | Freq: Two times a day (BID) | ORAL | 5 refills | Status: DC

## 2022-09-27 MED ORDER — TIZANIDINE HCL 4 MG PO TABS: 4.0000 mg | ORAL_TABLET | Freq: Every day | ORAL | 1 refills | Status: DC

## 2022-09-27 NOTE — Progress Notes (Signed)
Chief Complaint  Patient presents with   Knee Pain    Still hurting not as bad L > R   Ginger is here for follow-up she developed bilateral knee pain thought to be inflammatory and was put on prednisone for the last month.  She did notice some improvement  He still having pain  She is also having some discomfort in her cervical spine which is associated with some numbness and tingling on occasion maybe once a day  Weakness denied  BP (!) 151/90   Pulse 71   Ht 5\' 5"  (1.651 m)   Wt 207 lb (93.9 kg)   BMI 34.45 kg/m    Back Exam   Tenderness  The patient is experiencing tenderness in the cervical.  Range of Motion  Extension:  abnormal  Flexion:  normal  Lateral bend right:  abnormal  Lateral bend left:  abnormal  Rotation right:  abnormal  Rotation left:  abnormal      Assessment and plan  Encounter Diagnoses  Name Primary?   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

## 2022-11-29 ENCOUNTER — Other Ambulatory Visit: Payer: Self-pay | Admitting: Orthopedic Surgery

## 2022-11-29 DIAGNOSIS — M65962 Unspecified synovitis and tenosynovitis, left lower leg: Secondary | ICD-10-CM

## 2022-11-29 DIAGNOSIS — M542 Cervicalgia: Secondary | ICD-10-CM

## 2022-12-28 ENCOUNTER — Ambulatory Visit: Payer: Medicaid Other | Admitting: Orthopedic Surgery

## 2023-01-01 ENCOUNTER — Ambulatory Visit: Payer: Medicaid Other | Admitting: Orthopedic Surgery

## 2023-01-01 ENCOUNTER — Other Ambulatory Visit (HOSPITAL_COMMUNITY): Payer: Self-pay | Admitting: Family Medicine

## 2023-01-01 DIAGNOSIS — Z1231 Encounter for screening mammogram for malignant neoplasm of breast: Secondary | ICD-10-CM

## 2023-01-09 ENCOUNTER — Inpatient Hospital Stay (HOSPITAL_COMMUNITY): Admission: RE | Admit: 2023-01-09 | Payer: Medicaid Other | Source: Ambulatory Visit

## 2023-01-21 ENCOUNTER — Ambulatory Visit: Payer: Medicaid Other | Admitting: Orthopedic Surgery

## 2023-02-04 ENCOUNTER — Encounter: Payer: Self-pay | Admitting: Orthopedic Surgery

## 2023-02-04 ENCOUNTER — Ambulatory Visit: Payer: Medicaid Other | Admitting: Orthopedic Surgery

## 2023-02-04 VITALS — BP 200/129 | HR 80 | Ht 65.5 in | Wt 200.0 lb

## 2023-02-04 DIAGNOSIS — M65961 Unspecified synovitis and tenosynovitis, right lower leg: Secondary | ICD-10-CM

## 2023-02-04 DIAGNOSIS — M65962 Unspecified synovitis and tenosynovitis, left lower leg: Secondary | ICD-10-CM

## 2023-02-04 NOTE — Progress Notes (Signed)
FOLLOW-UP OFFICE VISIT   Patient: Bonnie Jones           Date of Birth: 1981-01-14           MRN: [id number] Visit Date: 02/04/2023 Requested by: Assunta Found, MD [address] [place],  [place] [address] PCP: Assunta Found, MD    Encounter Diagnosis  Name Primary?   Synovitis of both knee joints Yes    Chief Complaint  Patient presents with   Knee Pain    Both     Xara is doing well with Celebrex twice a day she did have a steroid pulse dose to help with the synovitis which did well.  No complications noted from the Celebrex  Both knees look quite today there is no swelling or warmth to the joint the skin looks normal the range of motion is full there is no palpable tenderness  ASSESSMENT AND PLAN Continue Celebrex twice a day  Use prednisone when needed for flareups  Follow-up for knee effusions

## 2023-02-04 NOTE — Patient Instructions (Signed)
CONTINUE CELEBREX

## 2023-02-15 ENCOUNTER — Ambulatory Visit (HOSPITAL_COMMUNITY)
Admission: RE | Admit: 2023-02-15 | Discharge: 2023-02-15 | Disposition: A | Discharge status: Home / Self Care | Payer: Medicaid Other | Source: Ambulatory Visit | Attending: Family Medicine | Admitting: Family Medicine

## 2023-02-15 ENCOUNTER — Encounter (HOSPITAL_COMMUNITY): Payer: Self-pay

## 2023-02-15 DIAGNOSIS — Z1231 Encounter for screening mammogram for malignant neoplasm of breast: Secondary | ICD-10-CM | POA: Diagnosis present

## 2023-03-03 ENCOUNTER — Other Ambulatory Visit: Payer: Self-pay | Admitting: Orthopedic Surgery

## 2023-03-03 DIAGNOSIS — M65962 Unspecified synovitis and tenosynovitis, left lower leg: Secondary | ICD-10-CM

## 2023-03-03 DIAGNOSIS — M542 Cervicalgia: Secondary | ICD-10-CM

## 2023-04-22 ENCOUNTER — Emergency Department (HOSPITAL_COMMUNITY)
Admission: EM | Admit: 2023-04-22 | Discharge: 2023-04-22 | Disposition: A | Discharge status: Home / Self Care | Payer: Worker's Compensation | Attending: Emergency Medicine | Admitting: Emergency Medicine

## 2023-04-22 ENCOUNTER — Other Ambulatory Visit: Payer: Self-pay

## 2023-04-22 ENCOUNTER — Emergency Department (HOSPITAL_COMMUNITY): Payer: Worker's Compensation

## 2023-04-22 ENCOUNTER — Encounter (HOSPITAL_COMMUNITY): Payer: Self-pay | Admitting: Emergency Medicine

## 2023-04-22 DIAGNOSIS — Y990 Civilian activity done for income or pay: Secondary | ICD-10-CM | POA: Insufficient documentation

## 2023-04-22 DIAGNOSIS — S6392XA Sprain of unspecified part of left wrist and hand, initial encounter: Secondary | ICD-10-CM | POA: Diagnosis not present

## 2023-04-22 DIAGNOSIS — W01198A Fall on same level from slipping, tripping and stumbling with subsequent striking against other object, initial encounter: Secondary | ICD-10-CM | POA: Diagnosis not present

## 2023-04-22 DIAGNOSIS — S6992XA Unspecified injury of left wrist, hand and finger(s), initial encounter: Secondary | ICD-10-CM | POA: Diagnosis present

## 2023-04-22 NOTE — ED Provider Notes (Signed)
  [place] EMERGENCY DEPARTMENT AT [facility] Provider Note   CSN: [id number] Arrival date & time: 04/22/23  4098     History  Chief Complaint  Patient presents with   Hand Injury    Bonnie Jones is a 43 y.o. female.  Patient is a 43 year old female presenting with complaints of a left hand injury.  She was at work this morning when she reports slipping on a wet floor and striking her hand on a storage bin.  She reports her fourth and fifth fingers were bent backwards and is having severe pain in this area.  The history is provided by the patient.       Home Medications Prior to Admission medications   Medication Sig Start Date End Date Taking? Authorizing Provider  celecoxib (CELEBREX) 200 MG capsule Take 1 capsule (200 mg total) by mouth 2 (two) times daily. 09/27/22   Vickki Hearing, MD  olmesartan (BENICAR) 40 MG tablet Take 40 mg by mouth daily. 03/30/21   [provider]  phentermine (ADIPEX-P) 37.5 MG tablet Take 1 tablet (37.5 mg total) by mouth daily before breakfast. Take 1 daily Patient not taking: Reported on 02/04/2023 11/23/21   Adline Potter, NP  tiZANidine (ZANAFLEX) 4 MG tablet TAKE 1 TABLET BY MOUTH EVERY DAY 03/06/23   Vickki Hearing, MD  valACYclovir (VALTREX) 500 MG tablet Take 1 tablet (500 mg total) by mouth 2 (two) times daily. Patient not taking: Reported on 02/04/2023 10/17/21   Adline Potter, NP      Allergies    Patient has no known allergies.    Review of Systems   Review of Systems  All other systems reviewed and are negative.   Physical Exam Updated Vital Signs BP (!) 156/106 (BP Location: Left Arm)   Pulse 69   Temp 98 F (36.7 C) (Oral)   Resp 16   Ht 5' 5.5" (1.664 m)   Wt 93 kg   LMP 04/08/2023   SpO2 100%   BMI 33.59 kg/m  Physical Exam Vitals and nursing note reviewed.  Constitutional:      Appearance: Normal appearance.  Pulmonary:     Effort: Pulmonary effort is normal.   Musculoskeletal:     Comments: The left hand as exquisite tenderness noted over the fourth and fifth metacarpals and fourth and fifth fingers.  Range of motion is limited secondary to pain.  Sensation is intact all fingertips and capillary refill is brisk.  Skin:    General: Skin is warm and dry.  Neurological:     Mental Status: She is alert and oriented to person, place, and time.     ED Results / Procedures / Treatments   Labs (all labs ordered are listed, but only abnormal results are displayed) Labs Reviewed - No data to display  EKG None  Radiology No results found.  Procedures Procedures    Medications Ordered in ED Medications - No data to display  ED Course/ Medical Decision Making/ A&P  X-rays are negative for fracture.  This to be treated as a sprain with an ulnar gutter splint, rest, ice, and follow-up as needed.  Final Clinical Impression(s) / ED Diagnoses Final diagnoses:  None    Rx / DC Orders ED Discharge Orders     None         Geoffery Lyons, MD 04/22/23 [phone]

## 2023-04-22 NOTE — ED Triage Notes (Signed)
 C/o left hand pain after slipping on a mat at work and falling with her hand hitting a bin. States her ringer finger and her little finger bent backwards.

## 2023-04-22 NOTE — Discharge Instructions (Signed)
 Wear splint for comfort and support.  Ice for 20 minutes every 2 hours while awake for the next 2 days.  Take ibuprofen 600 mg every 6 hours as needed for pain.  Follow-up with primary doctor if not improving in the next week.

## 2023-04-22 NOTE — ED Notes (Signed)
 Patient Alert and oriented to baseline. Stable and ambulatory to baseline. Patient verbalized understanding of the discharge instructions.  Patient belongings were taken by the patient.

## 2023-11-04 ENCOUNTER — Ambulatory Visit: Admitting: Adult Health

## 2023-11-06 ENCOUNTER — Encounter: Payer: Self-pay | Admitting: Adult Health

## 2023-11-11 ENCOUNTER — Ambulatory Visit: Admitting: Adult Health

## 2023-11-14 ENCOUNTER — Ambulatory Visit (INDEPENDENT_AMBULATORY_CARE_PROVIDER_SITE_OTHER): Admitting: Adult Health

## 2023-11-14 ENCOUNTER — Encounter: Payer: Self-pay | Admitting: Adult Health

## 2023-11-14 VITALS — BP 154/77 | HR 73 | Ht 65.5 in | Wt 195.5 lb

## 2023-11-14 DIAGNOSIS — N926 Irregular menstruation, unspecified: Secondary | ICD-10-CM | POA: Diagnosis not present

## 2023-11-14 DIAGNOSIS — Z3202 Encounter for pregnancy test, result negative: Secondary | ICD-10-CM | POA: Diagnosis not present

## 2023-11-14 DIAGNOSIS — N951 Menopausal and female climacteric states: Secondary | ICD-10-CM | POA: Diagnosis not present

## 2023-11-14 DIAGNOSIS — R232 Flushing: Secondary | ICD-10-CM

## 2023-11-14 DIAGNOSIS — I10 Essential (primary) hypertension: Secondary | ICD-10-CM | POA: Diagnosis not present

## 2023-11-14 LAB — POCT URINE PREGNANCY: Preg Test, Ur: NEGATIVE

## 2023-11-14 NOTE — Progress Notes (Signed)
  Subjective:     Patient ID: Bonnie Jones, female   DOB: 09-10-1980, 43 y.o.   MRN: [id number]  HPI Bonnie Jones is a 44 year old black female,single, [id number], in complaining of skipping period in August.      Component Value Date/Time   DIAGPAP  10/17/2021 1619    - Negative for intraepithelial lesion or malignancy (NILM)   HPVHIGH Negative 10/17/2021 1619   ADEQPAP  10/17/2021 1619    Satisfactory for evaluation; transformation zone component PRESENT.   PCP is Dr Marvine   Review of Systems Missed period in August Having some hot flashes Wakes up at night [place] at times Reviewed past medical,surgical, social and family history. Reviewed medications and allergies.     Objective:   Physical Exam BP (!) 154/77 (BP Location: Left Arm, Patient Position: Sitting, Cuff Size: Normal)   Pulse 73   Ht 5' 5.5 (1.664 m)   Wt 195 lb 8 oz (88.7 kg)   LMP 11/05/2023 (Exact Date)   BMI 32.04 kg/m  UPT is negative Skin warm and dry.  Lungs: clear to ausculation bilaterally. Cardiovascular: regular rate and rhythm.  Fall risk is low  Upstream - 11/14/23 1601       Pregnancy Intention Screening   Does the patient want to become pregnant in the next year? No    Does the patient's partner want to become pregnant in the next year? No    Would the patient like to discuss contraceptive options today? No      Contraception Wrap Up   Current Method Abstinence;Female Sterilization    End Method Abstinence;Female Sterilization    Contraception Counseling Provided No             Assessment:     1. Negative pregnancy test - POCT urine pregnancy  2. Irregular periods (Primary) Skipped August period Started 11/05/23  3. Hot flashes  4. Peri-menopause Discussed symptoms Review handout  She is trying to stop smoking  5. Benign essential hypertension Continue meds and follow up with PCP    Plan:     Follow up prn
